# Patient Record
Sex: Female | Born: 1955 | Race: White | Hispanic: No | Marital: Married | Smoking: Never smoker
Health system: Southern US, Community
[De-identification: ages and names within clinical notes are randomized; demographics above are authoritative.]

## PROBLEM LIST (undated history)

## (undated) DIAGNOSIS — C50919 Malignant neoplasm of unspecified site of unspecified female breast: Secondary | ICD-10-CM

## (undated) DIAGNOSIS — Z923 Personal history of irradiation: Secondary | ICD-10-CM

## (undated) DIAGNOSIS — R112 Nausea with vomiting, unspecified: Secondary | ICD-10-CM

## (undated) DIAGNOSIS — G4730 Sleep apnea, unspecified: Secondary | ICD-10-CM

## (undated) DIAGNOSIS — I10 Essential (primary) hypertension: Secondary | ICD-10-CM

## (undated) DIAGNOSIS — Z9889 Other specified postprocedural states: Secondary | ICD-10-CM

## (undated) DIAGNOSIS — E119 Type 2 diabetes mellitus without complications: Secondary | ICD-10-CM

## (undated) DIAGNOSIS — R51 Headache: Secondary | ICD-10-CM

## (undated) DIAGNOSIS — K219 Gastro-esophageal reflux disease without esophagitis: Secondary | ICD-10-CM

## (undated) DIAGNOSIS — R519 Headache, unspecified: Secondary | ICD-10-CM

## (undated) DIAGNOSIS — E785 Hyperlipidemia, unspecified: Secondary | ICD-10-CM

## (undated) DIAGNOSIS — N6019 Diffuse cystic mastopathy of unspecified breast: Secondary | ICD-10-CM

## (undated) DIAGNOSIS — F419 Anxiety disorder, unspecified: Secondary | ICD-10-CM

## (undated) HISTORY — PX: BREAST CYST ASPIRATION: SHX578

## (undated) HISTORY — PX: OTHER SURGICAL HISTORY: SHX169

## (undated) HISTORY — PX: COLONOSCOPY: SHX174

---
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
## 2004-02-08 ENCOUNTER — Ambulatory Visit: Payer: Self-pay | Admitting: Internal Medicine

## 2004-02-24 ENCOUNTER — Ambulatory Visit: Payer: Self-pay | Admitting: Internal Medicine

## 2004-04-07 ENCOUNTER — Ambulatory Visit: Payer: Self-pay

## 2004-08-30 ENCOUNTER — Ambulatory Visit: Payer: Self-pay | Admitting: Internal Medicine

## 2006-03-06 ENCOUNTER — Ambulatory Visit: Payer: Self-pay | Admitting: Internal Medicine

## 2006-04-22 ENCOUNTER — Ambulatory Visit: Payer: Self-pay | Admitting: Unknown Physician Specialty

## 2007-04-02 ENCOUNTER — Ambulatory Visit: Payer: Self-pay | Admitting: Internal Medicine

## 2008-04-02 ENCOUNTER — Ambulatory Visit: Payer: Self-pay | Admitting: Internal Medicine

## 2008-08-12 ENCOUNTER — Ambulatory Visit: Payer: Self-pay | Admitting: Internal Medicine

## 2009-04-19 ENCOUNTER — Ambulatory Visit: Payer: Self-pay | Admitting: Internal Medicine

## 2010-03-29 ENCOUNTER — Ambulatory Visit: Payer: Self-pay | Admitting: Internal Medicine

## 2010-04-26 ENCOUNTER — Ambulatory Visit: Payer: Self-pay | Admitting: Internal Medicine

## 2010-05-26 ENCOUNTER — Ambulatory Visit: Payer: Self-pay | Admitting: Internal Medicine

## 2011-06-26 ENCOUNTER — Ambulatory Visit: Payer: Self-pay | Admitting: Internal Medicine

## 2012-02-01 ENCOUNTER — Ambulatory Visit: Payer: Self-pay | Admitting: Unknown Physician Specialty

## 2012-02-04 LAB — PATHOLOGY REPORT

## 2012-07-24 ENCOUNTER — Ambulatory Visit: Payer: Self-pay | Admitting: Internal Medicine

## 2013-07-27 ENCOUNTER — Ambulatory Visit: Payer: Self-pay | Admitting: Internal Medicine

## 2014-08-18 ENCOUNTER — Other Ambulatory Visit: Payer: Self-pay | Admitting: Internal Medicine

## 2014-08-18 DIAGNOSIS — Z1231 Encounter for screening mammogram for malignant neoplasm of breast: Secondary | ICD-10-CM

## 2014-09-08 ENCOUNTER — Ambulatory Visit
Admission: RE | Admit: 2014-09-08 | Discharge: 2014-09-08 | Disposition: A | Discharge status: Home / Self Care | Payer: BC Managed Care – PPO | Source: Ambulatory Visit | Attending: Internal Medicine | Admitting: Internal Medicine

## 2014-09-08 DIAGNOSIS — Z1231 Encounter for screening mammogram for malignant neoplasm of breast: Secondary | ICD-10-CM | POA: Diagnosis present

## 2015-08-18 ENCOUNTER — Other Ambulatory Visit: Payer: Self-pay | Admitting: Internal Medicine

## 2015-08-18 DIAGNOSIS — Z1231 Encounter for screening mammogram for malignant neoplasm of breast: Secondary | ICD-10-CM

## 2015-09-13 ENCOUNTER — Other Ambulatory Visit: Payer: Self-pay | Admitting: Internal Medicine

## 2015-09-13 ENCOUNTER — Ambulatory Visit
Admission: RE | Admit: 2015-09-13 | Discharge: 2015-09-13 | Disposition: A | Discharge status: Home / Self Care | Payer: BC Managed Care – PPO | Source: Ambulatory Visit | Attending: Internal Medicine | Admitting: Internal Medicine

## 2015-09-13 DIAGNOSIS — Z1231 Encounter for screening mammogram for malignant neoplasm of breast: Secondary | ICD-10-CM | POA: Diagnosis not present

## 2016-07-04 ENCOUNTER — Other Ambulatory Visit: Payer: Self-pay | Admitting: Internal Medicine

## 2016-07-04 DIAGNOSIS — Z1231 Encounter for screening mammogram for malignant neoplasm of breast: Secondary | ICD-10-CM

## 2016-09-19 ENCOUNTER — Ambulatory Visit
Admission: RE | Admit: 2016-09-19 | Discharge: 2016-09-19 | Disposition: A | Discharge status: Home / Self Care | Payer: BC Managed Care – PPO | Source: Ambulatory Visit | Attending: Internal Medicine | Admitting: Internal Medicine

## 2016-09-19 DIAGNOSIS — Z1231 Encounter for screening mammogram for malignant neoplasm of breast: Secondary | ICD-10-CM | POA: Insufficient documentation

## 2017-03-26 DIAGNOSIS — Z923 Personal history of irradiation: Secondary | ICD-10-CM

## 2017-03-26 HISTORY — DX: Personal history of irradiation: Z92.3

## 2017-05-09 ENCOUNTER — Encounter: Payer: Self-pay | Admitting: *Deleted

## 2017-05-10 ENCOUNTER — Other Ambulatory Visit: Payer: Self-pay

## 2017-05-10 ENCOUNTER — Encounter: Payer: Self-pay | Admitting: Anesthesiology

## 2017-05-10 ENCOUNTER — Encounter
Admission: RE | Disposition: A | Discharge status: Home / Self Care | Payer: Self-pay | Source: Ambulatory Visit | Attending: Unknown Physician Specialty

## 2017-05-10 ENCOUNTER — Ambulatory Visit: Payer: BC Managed Care – PPO | Admitting: Anesthesiology

## 2017-05-10 ENCOUNTER — Ambulatory Visit
Admission: RE | Admit: 2017-05-10 | Discharge: 2017-05-10 | Disposition: A | Discharge status: Home / Self Care | Payer: BC Managed Care – PPO | Source: Ambulatory Visit | Attending: Unknown Physician Specialty | Admitting: Unknown Physician Specialty

## 2017-05-10 DIAGNOSIS — Z885 Allergy status to narcotic agent status: Secondary | ICD-10-CM | POA: Diagnosis not present

## 2017-05-10 DIAGNOSIS — K5730 Diverticulosis of large intestine without perforation or abscess without bleeding: Secondary | ICD-10-CM | POA: Insufficient documentation

## 2017-05-10 DIAGNOSIS — Z7982 Long term (current) use of aspirin: Secondary | ICD-10-CM | POA: Insufficient documentation

## 2017-05-10 DIAGNOSIS — Z882 Allergy status to sulfonamides status: Secondary | ICD-10-CM | POA: Diagnosis not present

## 2017-05-10 DIAGNOSIS — I10 Essential (primary) hypertension: Secondary | ICD-10-CM | POA: Insufficient documentation

## 2017-05-10 DIAGNOSIS — E119 Type 2 diabetes mellitus without complications: Secondary | ICD-10-CM | POA: Diagnosis not present

## 2017-05-10 DIAGNOSIS — D123 Benign neoplasm of transverse colon: Secondary | ICD-10-CM | POA: Insufficient documentation

## 2017-05-10 DIAGNOSIS — Z7984 Long term (current) use of oral hypoglycemic drugs: Secondary | ICD-10-CM | POA: Diagnosis not present

## 2017-05-10 DIAGNOSIS — Z1211 Encounter for screening for malignant neoplasm of colon: Secondary | ICD-10-CM | POA: Insufficient documentation

## 2017-05-10 DIAGNOSIS — Z800 Family history of malignant neoplasm of digestive organs: Secondary | ICD-10-CM | POA: Insufficient documentation

## 2017-05-10 DIAGNOSIS — D122 Benign neoplasm of ascending colon: Secondary | ICD-10-CM | POA: Insufficient documentation

## 2017-05-10 DIAGNOSIS — K529 Noninfective gastroenteritis and colitis, unspecified: Secondary | ICD-10-CM | POA: Insufficient documentation

## 2017-05-10 DIAGNOSIS — Z79899 Other long term (current) drug therapy: Secondary | ICD-10-CM | POA: Insufficient documentation

## 2017-05-10 DIAGNOSIS — G4730 Sleep apnea, unspecified: Secondary | ICD-10-CM | POA: Diagnosis not present

## 2017-05-10 DIAGNOSIS — Z86010 Personal history of colonic polyps: Secondary | ICD-10-CM | POA: Diagnosis not present

## 2017-05-10 DIAGNOSIS — E785 Hyperlipidemia, unspecified: Secondary | ICD-10-CM | POA: Diagnosis not present

## 2017-05-10 DIAGNOSIS — K635 Polyp of colon: Secondary | ICD-10-CM | POA: Insufficient documentation

## 2017-05-10 DIAGNOSIS — K640 First degree hemorrhoids: Secondary | ICD-10-CM | POA: Diagnosis not present

## 2017-05-10 DIAGNOSIS — Z880 Allergy status to penicillin: Secondary | ICD-10-CM | POA: Insufficient documentation

## 2017-05-10 DIAGNOSIS — K219 Gastro-esophageal reflux disease without esophagitis: Secondary | ICD-10-CM | POA: Diagnosis not present

## 2017-05-10 HISTORY — DX: Headache, unspecified: R51.9

## 2017-05-10 HISTORY — DX: Gastro-esophageal reflux disease without esophagitis: K21.9

## 2017-05-10 HISTORY — DX: Sleep apnea, unspecified: G47.30

## 2017-05-10 HISTORY — DX: Type 2 diabetes mellitus without complications: E11.9

## 2017-05-10 HISTORY — DX: Hyperlipidemia, unspecified: E78.5

## 2017-05-10 HISTORY — DX: Diffuse cystic mastopathy of unspecified breast: N60.19

## 2017-05-10 HISTORY — DX: Headache: R51

## 2017-05-10 HISTORY — PX: COLONOSCOPY WITH PROPOFOL: SHX5780

## 2017-05-10 HISTORY — DX: Essential (primary) hypertension: I10

## 2017-05-10 SURGERY — COLONOSCOPY WITH PROPOFOL
Anesthesia: General

## 2017-05-10 MED ORDER — LIDOCAINE HCL (PF) 2 % IJ SOLN
INTRAMUSCULAR | Status: AC
  Filled 2017-05-10: qty 10

## 2017-05-10 MED ORDER — PROPOFOL 500 MG/50ML IV EMUL
INTRAVENOUS | Status: AC
  Filled 2017-05-10: qty 50

## 2017-05-10 MED ORDER — SODIUM CHLORIDE 0.9 % IV SOLN
INTRAVENOUS | Status: DC
  Administered 2017-05-10: 1000 mL via INTRAVENOUS

## 2017-05-10 MED ORDER — PROPOFOL 10 MG/ML IV BOLUS
INTRAVENOUS | Status: AC
  Filled 2017-05-10: qty 20

## 2017-05-10 MED ORDER — PHENYLEPHRINE HCL 10 MG/ML IJ SOLN
INTRAMUSCULAR | Status: DC | PRN
  Administered 2017-05-10 (×3): 100 ug via INTRAVENOUS

## 2017-05-10 MED ORDER — SODIUM CHLORIDE 0.9 % IV SOLN: INTRAVENOUS | Status: DC

## 2017-05-10 MED ORDER — ONDANSETRON HCL 4 MG/2ML IJ SOLN
INTRAMUSCULAR | Status: AC
  Filled 2017-05-10: qty 2

## 2017-05-10 MED ORDER — PROPOFOL 500 MG/50ML IV EMUL
INTRAVENOUS | Status: DC | PRN
  Administered 2017-05-10: 140 ug/kg/min via INTRAVENOUS

## 2017-05-10 MED ORDER — FENTANYL CITRATE (PF) 100 MCG/2ML IJ SOLN
INTRAMUSCULAR | Status: DC | PRN
  Administered 2017-05-10 (×2): 50 ug via INTRAVENOUS

## 2017-05-10 MED ORDER — LIDOCAINE HCL (PF) 1 % IJ SOLN
INTRAMUSCULAR | Status: AC
  Administered 2017-05-10: 0.3 mL
  Filled 2017-05-10: qty 2

## 2017-05-10 MED ORDER — PROPOFOL 10 MG/ML IV BOLUS
INTRAVENOUS | Status: DC | PRN
  Administered 2017-05-10: 100 mg via INTRAVENOUS

## 2017-05-10 MED ORDER — ONDANSETRON HCL 4 MG/2ML IJ SOLN
INTRAMUSCULAR | Status: DC | PRN
  Administered 2017-05-10: 4 mg via INTRAVENOUS

## 2017-05-10 MED ORDER — FENTANYL CITRATE (PF) 100 MCG/2ML IJ SOLN
INTRAMUSCULAR | Status: AC
  Filled 2017-05-10: qty 2

## 2017-05-10 MED ORDER — LIDOCAINE 2% (20 MG/ML) 5 ML SYRINGE
INTRAMUSCULAR | Status: DC | PRN
  Administered 2017-05-10: 30 mg via INTRAVENOUS

## 2017-05-10 MED ORDER — EPHEDRINE SULFATE 50 MG/ML IJ SOLN
INTRAMUSCULAR | Status: DC | PRN
  Administered 2017-05-10: 10 mg via INTRAVENOUS

## 2017-05-10 NOTE — Anesthesia Post-op Follow-up Note (Signed)
Anesthesia QCDR form completed.        

## 2017-05-10 NOTE — Transfer of Care (Signed)
Immediate Anesthesia Transfer of Care Note  Patient: SAIGE CANTON  Procedure(s) Performed: COLONOSCOPY WITH PROPOFOL (N/A )  Patient Location: PACU and Endoscopy Unit  Anesthesia Type:General  Level of Consciousness: sedated  Airway & Oxygen Therapy: Patient Spontanous Breathing and Patient connected to nasal cannula oxygen  Post-op Assessment: Report given to RN and Post -op Vital signs reviewed and stable  Post vital signs: Reviewed and stable  Last Vitals:  Vitals:   05/10/17 0748  BP: 127/68  Pulse: 71  Resp: 20  Temp: (!) 35.8 C  SpO2: 99%    Last Pain:  Vitals:   05/10/17 0748  TempSrc: Tympanic  PainSc: 6       Patients Stated Pain Goal: 5 (49/96/92 4932)  Complications: No apparent anesthesia complications

## 2017-05-10 NOTE — Anesthesia Postprocedure Evaluation (Signed)
Anesthesia Post Note  Patient: Gail Rivas  Procedure(s) Performed: COLONOSCOPY WITH PROPOFOL (N/A )  Patient location during evaluation: PACU Anesthesia Type: General Level of consciousness: awake and alert Pain management: pain level controlled Vital Signs Assessment: post-procedure vital signs reviewed and stable Respiratory status: spontaneous breathing, nonlabored ventilation, respiratory function stable and patient connected to nasal cannula oxygen Cardiovascular status: blood pressure returned to baseline and stable Postop Assessment: no apparent nausea or vomiting Anesthetic complications: no     Last Vitals:  Vitals:   05/10/17 0859 05/10/17 0918  BP: 96/65 104/70  Pulse:    Resp:    Temp:    SpO2:      Last Pain:  Vitals:   05/10/17 0852  TempSrc:   PainSc: 0-No pain                 Weslee Prestage S

## 2017-05-10 NOTE — Anesthesia Preprocedure Evaluation (Signed)
Anesthesia Evaluation  Patient identified by MRN, date of birth, ID band Patient awake    Reviewed: Allergy & Precautions, NPO status , Patient's Chart, lab work & pertinent test results, reviewed documented beta blocker date and time   Airway Mallampati: III  TM Distance: >3 FB     Dental  (+) Chipped   Pulmonary sleep apnea ,           Cardiovascular hypertension, Pt. on medications and Pt. on home beta blockers      Neuro/Psych  Headaches,    GI/Hepatic GERD  ,  Endo/Other  diabetes, Type 2  Renal/GU      Musculoskeletal   Abdominal   Peds  Hematology   Anesthesia Other Findings Does not use CPAP.  Reproductive/Obstetrics                             Anesthesia Physical Anesthesia Plan  ASA: III  Anesthesia Plan: General   Post-op Pain Management:    Induction: Intravenous  PONV Risk Score and Plan:   Airway Management Planned:   Additional Equipment:   Intra-op Plan:   Post-operative Plan:   Informed Consent: I have reviewed the patients History and Physical, chart, labs and discussed the procedure including the risks, benefits and alternatives for the proposed anesthesia with the patient or authorized representative who has indicated his/her understanding and acceptance.     Plan Discussed with: CRNA  Anesthesia Plan Comments:         Anesthesia Quick Evaluation

## 2017-05-10 NOTE — H&P (Signed)
Primary Care Physician:  Idelle Crouch, MD Primary Gastroenterologist:  Dr. Vira Agar  Pre-Procedure History & Physical: HPI:  Gail Rivas is a 62 y.o. female is here for an colonoscopy.   Past Medical History:  Diagnosis Date  . Diabetes mellitus without complication ([place])   . Fibrocystic disease of breast   . GERD (gastroesophageal reflux disease)   . Headache   . Hyperlipidemia   . Hypertension   . Sleep apnea     Past Surgical History:  Procedure Laterality Date  . BREAST CYST ASPIRATION Left   . COLONOSCOPY    . wisdom teeth removal      Prior to Admission medications   Medication Sig Start Date End Date Taking? Authorizing Provider  aspirin EC 81 MG tablet Take 81 mg by mouth daily.   Yes [provider]  canagliflozin (INVOKANA) 300 MG TABS tablet Take 300 mg by mouth daily before breakfast.   Yes [provider]  cetirizine (ZYRTEC) 10 MG tablet Take 10 mg by mouth daily.   Yes [provider]  glimepiride (AMARYL) 4 MG tablet Take 4 mg by mouth 2 (two) times daily.   Yes [provider]  ibuprofen (ADVIL,MOTRIN) 200 MG tablet Take 200 mg by mouth every 6 (six) hours as needed.   Yes [provider]  Lactobacillus Rhamnosus, GG, ([employer] PROBIOTIC, LACTOBACILLUS, PO) Take by mouth as needed.   Yes [provider]  losartan-hydrochlorothiazide (HYZAAR) 100-25 MG tablet Take 1 tablet by mouth daily.   Yes [provider]  metFORMIN (GLUCOPHAGE) 1000 MG tablet Take 1,000 mg by mouth 2 (two) times daily with a meal.   Yes [provider]  metoprolol (TOPROL-XL) 200 MG 24 hr tablet Take 200 mg by mouth daily.   Yes [provider]  sitaGLIPtin (JANUVIA) 100 MG tablet Take 100 mg by mouth daily.   Yes [provider]    Allergies as of 02/11/2017  . (Not on File)    Family History  Problem Relation Age of Onset  . Hypertension Mother   . Colon cancer Father   . Liver  cancer Father   . Diabetes Father   . Breast cancer Neg Hx     Social History   Socioeconomic History  . Marital status: Married    Spouse name: Not on file  . Number of children: Not on file  . Years of education: Not on file  . Highest education level: Not on file  Social Needs  . Financial resource strain: Not on file  . Food insecurity - worry: Not on file  . Food insecurity - inability: Not on file  . Transportation needs - medical: Not on file  . Transportation needs - non-medical: Not on file  Occupational History  . Not on file  Tobacco Use  . Smoking status: Never Smoker  . Smokeless tobacco: Never Used  Substance and Sexual Activity  . Alcohol use: No    Frequency: Never  . Drug use: No  . Sexual activity: Not on file  Other Topics Concern  . Not on file  Social History Narrative  . Not on file    Review of Systems: See HPI, otherwise negative ROS  Physical Exam: BP 127/68   Pulse 71   Temp (!) 96.5 F (35.8 C) (Tympanic)   Resp 20   Ht 5\' 5"  (1.651 m)   Wt 66.2 kg (146 lb)   SpO2 99%   BMI 24.30 kg/m  General:  Alert,  pleasant and cooperative in NAD Head:  Normocephalic and atraumatic. Neck:  Supple; no masses or thyromegaly. Lungs:  Clear throughout to auscultation.    Heart:  Regular rate and rhythm. Abdomen:  Soft, nontender and nondistended. Normal bowel sounds, without guarding, and without rebound.   Neurologic:  Alert and  oriented x4;  grossly normal neurologically.  Impression/Plan: Gail Rivas is here for an colonoscopy to be performed for [facility] colon polyps. And family history of colon cancer in father.  Risks, benefits, limitations, and alternatives regarding  colonoscopy have been reviewed with the patient.  Questions have been answered.  All parties agreeable.   Gail Cheers, MD  05/10/2017, 8:10 AM

## 2017-05-10 NOTE — Op Note (Signed)
[facility] Patient Name: Gail Rivas Procedure Date: 05/10/2017 8:04 AM MRN: [id number] Account #: [id number] Date of Birth: 1956-01-07 Admit Type: Outpatient Age: 62 Room: [facility] ENDO ROOM 1 Gender: Female Note Status: Finalized Procedure:            Colonoscopy Indications:          High risk colon cancer surveillance: Personal history                        of colonic polyps Providers:            Manya Silvas, MD Referring MD:         Leonie Douglas. Doy Hutching, MD (Referring MD) Medicines:            Propofol per Anesthesia Complications:        No immediate complications. Procedure:            Pre-Anesthesia Assessment:                       - After reviewing the risks and benefits, the patient                        was deemed in satisfactory condition to undergo the                        procedure.                       After obtaining informed consent, the colonoscope was                        passed under direct vision. Throughout the procedure,                        the patient's blood pressure, pulse, and oxygen                        saturations were monitored continuously. The                        Colonoscope was introduced through the anus and                        advanced to the the cecum, identified by appendiceal                        orifice and ileocecal valve. The colonoscopy was                        performed without difficulty. The patient tolerated the                        procedure well. The quality of the bowel preparation                        was good. Findings:      A small polyp was found in the hepatic flexure. The polyp was sessile.       The polyp was removed with a hot snare. Resection and retrieval were       complete.      A diminutive polyp was found  in the cecum. The polyp was sessile. The       polyp was removed with a jumbo cold forceps. Resection and retrieval       were complete.      A  diminutive polyp was found in the ascending colon. The polyp was       sessile. The polyp was removed with a jumbo cold forceps. Resection and       retrieval were complete.      A small polyp was found in the sigmoid colon. The polyp was sessile. The       polyp was removed with a hot snare. Resection and retrieval were       complete.      Multiple small-mouthed diverticula were found in the sigmoid colon,       descending colon, transverse colon and ascending colon.      Internal hemorrhoids were found during endoscopy. The hemorrhoids were       small and Grade I (internal hemorrhoids that do not prolapse). Impression:           - One small polyp at the hepatic flexure, removed with                        a hot snare. Resected and retrieved.                       - One diminutive polyp in the cecum, removed with a                        jumbo cold forceps. Resected and retrieved.                       - One diminutive polyp in the ascending colon, removed                        with a jumbo cold forceps. Resected and retrieved.                       - One small polyp in the sigmoid colon, removed with a                        hot snare. Resected and retrieved.                       - Diverticulosis in the sigmoid colon, in the                        descending colon, in the transverse colon and in the                        ascending colon.                       - Internal hemorrhoids. Recommendation:       - Await pathology results. Manya Silvas, MD 05/10/2017 8:47:30 AM This report has been signed electronically. Number of Addenda: 0 Note Initiated On: 05/10/2017 8:04 AM Scope Withdrawal Time: 0 hours 14 minutes 34 seconds  Total Procedure Duration: 0 hours 25 minutes 21 seconds       [facility]

## 2017-05-11 NOTE — Progress Notes (Signed)
Non-identifying voicemail.  No message left.

## 2017-05-13 ENCOUNTER — Encounter: Payer: Self-pay | Admitting: Unknown Physician Specialty

## 2017-05-13 LAB — SURGICAL PATHOLOGY

## 2017-09-17 ENCOUNTER — Other Ambulatory Visit: Payer: Self-pay | Admitting: Internal Medicine

## 2017-09-17 DIAGNOSIS — R911 Solitary pulmonary nodule: Secondary | ICD-10-CM

## 2017-09-24 ENCOUNTER — Ambulatory Visit
Admission: RE | Admit: 2017-09-24 | Discharge: 2017-09-24 | Disposition: A | Discharge status: Home / Self Care | Payer: BC Managed Care – PPO | Source: Ambulatory Visit | Attending: Internal Medicine | Admitting: Internal Medicine

## 2017-09-24 DIAGNOSIS — R911 Solitary pulmonary nodule: Secondary | ICD-10-CM | POA: Diagnosis not present

## 2017-09-25 ENCOUNTER — Other Ambulatory Visit: Payer: Self-pay | Admitting: Internal Medicine

## 2017-09-25 ENCOUNTER — Ambulatory Visit
Admission: RE | Admit: 2017-09-25 | Discharge: 2017-09-25 | Disposition: A | Discharge status: Home / Self Care | Payer: Self-pay | Source: Ambulatory Visit | Attending: Internal Medicine | Admitting: Internal Medicine

## 2017-09-25 DIAGNOSIS — R911 Solitary pulmonary nodule: Secondary | ICD-10-CM

## 2017-09-30 ENCOUNTER — Other Ambulatory Visit: Payer: Self-pay | Admitting: Internal Medicine

## 2017-09-30 DIAGNOSIS — Z1231 Encounter for screening mammogram for malignant neoplasm of breast: Secondary | ICD-10-CM

## 2017-10-15 ENCOUNTER — Ambulatory Visit
Admission: RE | Admit: 2017-10-15 | Discharge: 2017-10-15 | Disposition: A | Discharge status: Home / Self Care | Payer: BC Managed Care – PPO | Source: Ambulatory Visit | Attending: Internal Medicine | Admitting: Internal Medicine

## 2017-10-15 DIAGNOSIS — Z1231 Encounter for screening mammogram for malignant neoplasm of breast: Secondary | ICD-10-CM | POA: Diagnosis not present

## 2017-10-16 ENCOUNTER — Other Ambulatory Visit: Payer: Self-pay | Admitting: Internal Medicine

## 2017-10-16 DIAGNOSIS — R928 Other abnormal and inconclusive findings on diagnostic imaging of breast: Secondary | ICD-10-CM

## 2017-10-16 DIAGNOSIS — N6320 Unspecified lump in the left breast, unspecified quadrant: Secondary | ICD-10-CM

## 2017-10-23 ENCOUNTER — Ambulatory Visit
Admission: RE | Admit: 2017-10-23 | Discharge: 2017-10-23 | Disposition: A | Discharge status: Home / Self Care | Payer: BC Managed Care – PPO | Source: Ambulatory Visit | Attending: Internal Medicine | Admitting: Internal Medicine

## 2017-10-23 DIAGNOSIS — R928 Other abnormal and inconclusive findings on diagnostic imaging of breast: Secondary | ICD-10-CM

## 2017-10-23 DIAGNOSIS — N6320 Unspecified lump in the left breast, unspecified quadrant: Secondary | ICD-10-CM

## 2017-10-24 ENCOUNTER — Other Ambulatory Visit: Payer: Self-pay | Admitting: Internal Medicine

## 2017-10-24 DIAGNOSIS — C50919 Malignant neoplasm of unspecified site of unspecified female breast: Secondary | ICD-10-CM

## 2017-10-24 DIAGNOSIS — N6320 Unspecified lump in the left breast, unspecified quadrant: Secondary | ICD-10-CM

## 2017-10-24 DIAGNOSIS — R928 Other abnormal and inconclusive findings on diagnostic imaging of breast: Secondary | ICD-10-CM

## 2017-10-24 HISTORY — DX: Malignant neoplasm of unspecified site of unspecified female breast: C50.919

## 2017-10-30 ENCOUNTER — Ambulatory Visit
Admission: RE | Admit: 2017-10-30 | Discharge: 2017-10-30 | Disposition: A | Discharge status: Home / Self Care | Payer: BC Managed Care – PPO | Source: Ambulatory Visit | Attending: Internal Medicine | Admitting: Internal Medicine

## 2017-10-30 DIAGNOSIS — R928 Other abnormal and inconclusive findings on diagnostic imaging of breast: Secondary | ICD-10-CM

## 2017-10-30 DIAGNOSIS — N6320 Unspecified lump in the left breast, unspecified quadrant: Secondary | ICD-10-CM

## 2017-10-30 HISTORY — PX: BREAST BIOPSY: SHX20

## 2017-11-01 ENCOUNTER — Encounter: Payer: Self-pay | Admitting: *Deleted

## 2017-11-01 NOTE — Progress Notes (Signed)
  Oncology Nurse Navigator Documentation  Navigator Location: [facility]Med Onc (11/01/17 1400)   )Navigator Encounter Type: Introductory phone call (11/01/17 1400)   Abnormal Finding Date: 10/23/17 (11/01/17 1400) Confirmed Diagnosis Date: 10/31/17 (11/01/17 1400)                   Barriers/Navigation Needs: Coordination of Care (11/01/17 1400)   Interventions: Coordination of Care (11/01/17 1400)   Coordination of Care: Appts (11/01/17 1400)                  Time Spent with Patient: 30 (11/01/17 1400)   Called patient to establish navigation services.  She is newly diagnosed with invasive breast cancer.  Patient states she has an appointment on Monday with Dr. Lysle Pearl on Monday the 12th.  Informed I would get her scheduled to see a Medical oncologist also.  She is agreeable.  Will call her back on Monday with an appointment.  Will give educational literature at that time.

## 2017-11-04 ENCOUNTER — Encounter: Payer: Self-pay | Admitting: *Deleted

## 2017-11-04 LAB — SURGICAL PATHOLOGY

## 2017-11-04 NOTE — Progress Notes (Signed)
  Oncology Nurse Navigator Documentation  Navigator Location: [facility]Med Onc (11/04/17 1500) Referral date to RadOnc/MedOnc: 11/06/17 (11/04/17 1500) )Navigator Encounter Type: Telephone (11/04/17 1500) Telephone: Lahoma Crocker Call (11/04/17 1500)                       Barriers/Navigation Needs: Coordination of Care (11/04/17 1500)   Interventions: Coordination of Care (11/04/17 1500)   Coordination of Care: Appts (11/04/17 1500)                  Time Spent with Patient: 15 (11/04/17 1500)   Left patient a message with her appointment scheduled to see Dr. Tasia Catchings for medical oncology on 11/06/17 @ 2:30.

## 2017-11-06 ENCOUNTER — Other Ambulatory Visit: Payer: Self-pay

## 2017-11-06 ENCOUNTER — Encounter: Payer: Self-pay | Admitting: Oncology

## 2017-11-06 ENCOUNTER — Inpatient Hospital Stay: Payer: BC Managed Care – PPO | Attending: Oncology | Admitting: Oncology

## 2017-11-06 ENCOUNTER — Inpatient Hospital Stay: Payer: BC Managed Care – PPO

## 2017-11-06 ENCOUNTER — Encounter: Payer: Self-pay | Admitting: *Deleted

## 2017-11-06 VITALS — BP 147/91 | HR 70 | Temp 96.7°F | Resp 18 | Ht 65.0 in | Wt 157.4 lb

## 2017-11-06 DIAGNOSIS — Z8041 Family history of malignant neoplasm of ovary: Secondary | ICD-10-CM | POA: Insufficient documentation

## 2017-11-06 DIAGNOSIS — Z800 Family history of malignant neoplasm of digestive organs: Secondary | ICD-10-CM | POA: Diagnosis not present

## 2017-11-06 DIAGNOSIS — I10 Essential (primary) hypertension: Secondary | ICD-10-CM | POA: Diagnosis not present

## 2017-11-06 DIAGNOSIS — E119 Type 2 diabetes mellitus without complications: Secondary | ICD-10-CM | POA: Insufficient documentation

## 2017-11-06 DIAGNOSIS — C50412 Malignant neoplasm of upper-outer quadrant of left female breast: Secondary | ICD-10-CM

## 2017-11-06 DIAGNOSIS — Z170 Estrogen receptor positive status [ER+]: Secondary | ICD-10-CM | POA: Diagnosis not present

## 2017-11-06 LAB — COMPREHENSIVE METABOLIC PANEL
ALK PHOS: 59 U/L (ref 38–126)
ALT: 30 U/L (ref 0–44)
ANION GAP: 13 (ref 5–15)
AST: 28 U/L (ref 15–41)
Albumin: 4.6 g/dL (ref 3.5–5.0)
BILIRUBIN TOTAL: 1.4 mg/dL — AB (ref 0.3–1.2)
BUN: 17 mg/dL (ref 8–23)
CALCIUM: 9.4 mg/dL (ref 8.9–10.3)
CO2: 23 mmol/L (ref 22–32)
Chloride: 103 mmol/L (ref 98–111)
Creatinine, Ser: 0.8 mg/dL (ref 0.44–1.00)
Glucose, Bld: 138 mg/dL — ABNORMAL HIGH (ref 70–99)
POTASSIUM: 3.8 mmol/L (ref 3.5–5.1)
Sodium: 139 mmol/L (ref 135–145)
TOTAL PROTEIN: 7.4 g/dL (ref 6.5–8.1)

## 2017-11-06 LAB — CBC WITH DIFFERENTIAL/PLATELET
BASOS ABS: 0 10*3/uL (ref 0–0.1)
BASOS PCT: 1 %
Eosinophils Absolute: 0.1 10*3/uL (ref 0–0.7)
Eosinophils Relative: 1 %
HEMATOCRIT: 41.8 % (ref 35.0–47.0)
Hemoglobin: 14.2 g/dL (ref 12.0–16.0)
LYMPHS PCT: 32 %
Lymphs Abs: 2.5 10*3/uL (ref 1.0–3.6)
MCH: 31 pg (ref 26.0–34.0)
MCHC: 33.9 g/dL (ref 32.0–36.0)
MCV: 91.5 fL (ref 80.0–100.0)
MONO ABS: 0.5 10*3/uL (ref 0.2–0.9)
Monocytes Relative: 6 %
NEUTROS ABS: 4.8 10*3/uL (ref 1.4–6.5)
Neutrophils Relative %: 60 %
PLATELETS: 190 10*3/uL (ref 150–440)
RBC: 4.56 MIL/uL (ref 3.80–5.20)
RDW: 13.5 % (ref 11.5–14.5)
WBC: 8 10*3/uL (ref 3.6–11.0)

## 2017-11-06 NOTE — Progress Notes (Signed)
Hematology/Oncology Consult note [facility] Telephone:([phone] [phone]   Patient Care Team: Idelle Crouch, MD as PCP - General (Internal Medicine)  REFERRING PROVIDER: Idelle Crouch, MD CHIEF COMPLAINTS/REASON FOR VISIT:  Evaluation of breast cancer  HISTORY OF PRESENTING ILLNESS:  Gail Rivas is a  62 y.o.  female with PMH listed below who was referred to me for evaluation of breast cancer. Patient had screening mammogram and ultrasound on 10/15/2017 which showed left breast possible mass warrant further evaluation. Diagnostic breast mammogram on 7/31 2019 showed 6 mm spiculated mass slight distortion in the outer left breast.  Targeted ultrasound was performed showing no definite sonographic correlate is identified in the left breast.  Left axilla is negative for lymphadenopathy.   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  Grade 1, ER PR positive [>90%], HER-2 negative  Family history: Maternal grandmother was diagnosed with ovarian cancer in the 47s.  Father had colon cancer. History of radiation to chest: denies.    Patient appears very nervous today.  She is very tearful.  She ask them I am going to be okay.  She is accompanied by her husband. Otherwise reports feeling well.  Denies any cough, chest pain, abdominal pain, back pain  Review of Systems  Constitutional: Negative for chills, fever, malaise/fatigue and weight loss.  HENT: Negative for nosebleeds and sore throat.   Eyes: Negative for double vision, photophobia and redness.  Respiratory: Negative for cough, shortness of breath and wheezing.   Cardiovascular: Negative for chest pain, palpitations and orthopnea.  Gastrointestinal: Negative for abdominal pain, blood in stool, nausea and vomiting.  Genitourinary: Negative for dysuria.  Musculoskeletal: Negative for back pain,  myalgias and neck pain.  Skin: Negative for itching and rash.  Neurological: Negative for dizziness, tingling and tremors.  Endo/Heme/Allergies: Negative for environmental allergies. Does not bruise/bleed easily.  Psychiatric/Behavioral: Negative for depression. The patient is nervous/anxious.     MEDICAL HISTORY:  Past Medical History:  Diagnosis Date  . Diabetes mellitus without complication ([place])   . Fibrocystic disease of breast   . GERD (gastroesophageal reflux disease)   . Headache   . Hyperlipidemia   . Hypertension   . Sleep apnea     SURGICAL HISTORY: Past Surgical History:  Procedure Laterality Date  . BREAST BIOPSY Left 10/30/2017   left breast  stereo path penidng coil clip  . BREAST CYST ASPIRATION Left   . COLONOSCOPY    . COLONOSCOPY WITH PROPOFOL N/A 05/10/2017   Procedure: COLONOSCOPY WITH PROPOFOL;  Surgeon: Manya Silvas, MD;  Location: [facility] ENDOSCOPY;  Service: Endoscopy;  Laterality: N/A;  . wisdom teeth removal      SOCIAL HISTORY: Social History   Socioeconomic History  . Marital status: Married    Spouse name: Not on file  . Number of children: Not on file  . Years of education: Not on file  . Highest education level: Not on file  Occupational History  . Not on file  Social Needs  . Financial resource strain: Not on file  . Food insecurity:    Worry: Not on file    Inability: Not on file  . Transportation needs:    Medical: Not on file    Non-medical: Not on file  Tobacco Use  . Smoking status: Never Smoker  . Smokeless tobacco: Never Used  Substance and Sexual Activity  . Alcohol use: No    Frequency:  Never  . Drug use: No  . Sexual activity: Not on file  Lifestyle  . Physical activity:    Days per week: Not on file    Minutes per session: Not on file  . Stress: Not on file  Relationships  . Social connections:    Talks on phone: Not on file    Gets together: Not on file    Attends religious service: Not on file    Active  member of club or organization: Not on file    Attends meetings of clubs or organizations: Not on file    Relationship status: Not on file  . Intimate partner violence:    Fear of current or ex partner: Not on file    Emotionally abused: Not on file    Physically abused: Not on file    Forced sexual activity: Not on file  Other Topics Concern  . Not on file  Social History Narrative  . Not on file    FAMILY HISTORY: Family History  Problem Relation Age of Onset  . Hypertension Mother   . Colon cancer Father   . Liver cancer Father   . Diabetes Father   . Ovarian cancer Maternal Grandmother   . Breast cancer Neg Hx     ALLERGIES:  is allergic to codeine; sulfa antibiotics; and penicillin v.  MEDICATIONS:  Current Outpatient Medications  Medication Sig Dispense Refill  . aspirin EC 81 MG tablet Take 81 mg by mouth daily.    . cetirizine (ZYRTEC) 10 MG tablet Take 10 mg by mouth daily.    . clotrimazole-betamethasone (LOTRISONE) cream Apply topically.    Marland Kitchen glimepiride (AMARYL) 4 MG tablet Take 4 mg by mouth 2 (two) times daily.    Marland Kitchen ibuprofen (ADVIL,MOTRIN) 200 MG tablet Take 200 mg by mouth every 6 (six) hours as needed.    . Lactobacillus Rhamnosus, GG, ([employer] PROBIOTIC, LACTOBACILLUS, PO) Take by mouth as needed.    . metFORMIN (GLUCOPHAGE) 1000 MG tablet Take 1,000 mg by mouth 2 (two) times daily with a meal.    . metoprolol (TOPROL-XL) 200 MG 24 hr tablet Take 200 mg by mouth daily.    . polyethylene glycol-electrolytes (NULYTELY/GOLYTELY) 420 g solution Take by mouth.    . sitaGLIPtin (JANUVIA) 100 MG tablet Take 100 mg by mouth daily.    Marland Kitchen telmisartan-hydrochlorothiazide (MICARDIS HCT) 80-25 MG tablet Take by mouth. Take 1 tablet by mouth daily    . canagliflozin (INVOKANA) 300 MG TABS tablet Take 300 mg by mouth daily before breakfast.    . losartan-hydrochlorothiazide (HYZAAR) 100-25 MG tablet Take 1 tablet by mouth daily.     No current facility-administered  medications for this visit.      PHYSICAL EXAMINATION: ECOG PERFORMANCE STATUS: 0 - Asymptomatic Vitals:   11/06/17 1432  BP: (!) 147/91  Pulse: 70  Resp: 18  Temp: (!) 96.7 F (35.9 C)   Filed Weights   11/06/17 1432  Weight: 157 lb 6.4 oz (71.4 kg)    Physical Exam  Constitutional: She is oriented to person, place, and time. She appears well-developed and well-nourished. No distress.  HENT:  Head: Normocephalic and atraumatic.  Right Ear: External ear normal.  Left Ear: External ear normal.  Mouth/Throat: Oropharynx is clear and moist.  Eyes: Pupils are equal, round, and reactive to light. EOM are normal. No scleral icterus.  Neck: Normal range of motion. Neck supple.  Cardiovascular: Normal rate, regular rhythm and normal heart sounds.  Pulmonary/Chest: Effort normal.  No respiratory distress. She has no wheezes.  Abdominal: Soft. Bowel sounds are normal. She exhibits no distension and no mass. There is no tenderness.  Musculoskeletal: Normal range of motion. She exhibits no edema or deformity.  Neurological: She is alert and oriented to person, place, and time. No cranial nerve deficit. Coordination normal.  Skin: Skin is warm and dry. No rash noted. No erythema.  Psychiatric: She has a normal mood and affect. Her behavior is normal. Thought content normal.  Breast exam was performed in seated and lying down position. No palpable mass bilateral breast.  No palpable axillary lymph node bilaterally.     LABORATORY DATA:  I have reviewed the data as listed Lab Results  Component Value Date   WBC 8.0 11/06/2017   HGB 14.2 11/06/2017   HCT 41.8 11/06/2017   MCV 91.5 11/06/2017   PLT 190 11/06/2017   Recent Labs    11/06/17 1546  NA 139  K 3.8  CL 103  CO2 23  GLUCOSE 138*  BUN 17  CREATININE 0.80  CALCIUM 9.4  GFRNONAA >60  GFRAA >60  PROT 7.4  ALBUMIN 4.6  AST 28  ALT 30  ALKPHOS 59  BILITOT 1.4*   Iron/TIBC/Ferritin/ %Sat No results found for:  IRON, TIBC, FERRITIN, IRONPCTSAT      ASSESSMENT & PLAN:  1. Malignant neoplasm of upper-outer quadrant of left breast in female, estrogen receptor positive ([place])   2. Family history of ovarian cancer    # Image was independently reviewed by me and discussed with patient.  I also discussed with her about the pathology reports, breast cancer diagnosis, extent of the disease and prognosis. It appears that she has likely stage I breast cancer ER PR positive HER-2 negative. Recommend lumpectomy with sentinel lymph node biopsy.  If no lymph node involvement, recommend sending Oncotype DX for further evaluate if she needs chemotherapy.  Adjuvant treatment plan pending final surgical pathology and will be discussed post surgery. Check baseline CBC and CMP. #Family history of ovarian cancer, personal history of breast cancer recommend genetic testing.  Will refer to genetic counselor.  Patient is a very nervous and had a lot of questions.  All questions answered to her satisfaction. Orders Placed This Encounter  Procedures  . CBC with Differential/Platelet    Standing Status:   Future    Number of Occurrences:   1    Standing Expiration Date:   11/07/2018  . Comprehensive metabolic panel    Standing Status:   Future    Number of Occurrences:   1    Standing Expiration Date:   11/07/2018    All questions were answered. The patient knows to call the clinic with any problems questions or concerns.  Return of visit: To be determined. Thank you for this kind referral and the opportunity to participate in the care of this patient. A copy of today's note is routed to referring provider  Total face to face encounter time for this patient visit was  60 min. >50% of the time was  spent in counseling and coordination of care.    Earlie Server, MD, PhD Hematology Oncology [facility] Pager- [phone] 11/06/2017

## 2017-11-06 NOTE — Progress Notes (Signed)
Patient here for follow up. She is very tearful and upset due to new dx.

## 2017-11-06 NOTE — Progress Notes (Signed)
  Oncology Nurse Navigator Documentation  Navigator Location: [facility] Onc (11/06/17 1500) Referral date to RadOnc/MedOnc: 11/06/17 (11/06/17 1500) )Navigator Encounter Type: Initial MedOnc (11/06/17 1500)           Genetic Counseling Type: Non-Urgent (11/06/17 1500)         Patient Visit Type: MedOnc (11/06/17 1500) Treatment Phase: Pre-Tx/Tx Discussion (11/06/17 1500) Barriers/Navigation Needs: Education (11/06/17 1500) Education: Newly Diagnosed Cancer Education (11/06/17 1500) Interventions: Education (11/06/17 1500)     Education Method: Verbal;Written (11/06/17 1500)  Support Groups/Services: Breast Support Group (11/06/17 1500)             Time Spent with Patient: 3 (11/06/17 1500)   Met patient and her husband today during her initial medical oncology consult with Dr. Tasia Catchings.  Patient and I went to elementary school together at [employer]. Patient has already met with Dr. Lysle Pearl for surgical consult.  Surgery date for lumpectomy is pending.  Gave patient breast cancer educational literature, "My Breast Cancer Treatment Handbook" by Josephine Igo, RN.  She is to call if she has any questions or needs.

## 2017-11-07 ENCOUNTER — Telehealth: Payer: Self-pay | Admitting: Genetic Counselor

## 2017-11-07 NOTE — Telephone Encounter (Signed)
Dr. Tasia Catchings is referring [name] for genetic counseling due to a personal and family history of cancer. I left her a message to call and schedule this telegenetics visit to be done by phone at her convenience.   Gail Rivas, [place], [place] Genetic Counselor Phone: [phone]

## 2017-11-11 ENCOUNTER — Other Ambulatory Visit: Payer: Self-pay | Admitting: Surgery

## 2017-11-11 DIAGNOSIS — C50412 Malignant neoplasm of upper-outer quadrant of left female breast: Secondary | ICD-10-CM

## 2017-11-12 ENCOUNTER — Ambulatory Visit: Payer: Self-pay | Admitting: Surgery

## 2017-11-12 ENCOUNTER — Other Ambulatory Visit: Payer: Self-pay | Admitting: Surgery

## 2017-11-12 DIAGNOSIS — C50412 Malignant neoplasm of upper-outer quadrant of left female breast: Secondary | ICD-10-CM

## 2017-11-12 NOTE — H&P (Signed)
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 female who was referred by Idelle Crouch, MD for evaluation of breast mass. Change was noted on last screening mammogram. Patient does not routinely do self breast exams.   Age of menarche was 88. Age of menopause was 35. Patient admits to hormonal thera Patient denies nipple discharge. Patient denies previous breast biopsy but states she has had a cyst drained. Patient denies a personal history of breast cancer.   Past Medical History:  has a past medical history of Diabetes mellitus type 2, uncomplicated (CMS-HCC), Fibrocystic disease of breast, GERD (gastroesophageal reflux disease), Headache, Hyperlipidemia, Hypertension, and Sleep apnea.  Past Surgical History:  has a past surgical history that includes [place]; Colonoscopy (04/07/2004); Colonoscopy (02/01/2012, 02/21/2007); and Colonoscopy (05/10/2017).left breast cyst  Family History: family history includes Colon cancer in her father; Diabetes type II in her father; High blood pressure (Hypertension) in her mother; Liver cancer in her father.  MGM hx of ovarian CA at age85  Social History:  reports that she has never smoked. She has never used smokeless tobacco. She reports that she does not drink alcohol. Her drug history is not on file.  Current Medications: has a current medication list which includes the following prescription(s): aspirin, cetirizine, clotrimazole-betamethasone, glimepiride, ibuprofen, lactobac no.41/bifidobact no.7, metformin, metoprolol succinate, peg-electrolyte, pioglitazone, sitagliptin, and telmisartan-hydrochlorothiazide.  Allergies:       Allergies as of 11/04/2017 - Reviewed 11/04/2017  Allergen Reaction Noted  . Codeine Vomiting 01/24/2017  . Penicillin v Rash 06/13/2013  . Sulfa (sulfonamide antibiotics) Vomiting 06/13/2013     ROS:  A 15 point review of systems was performed and was negative except as noted in HPI   Objective:   BP 113/74   Pulse 64   Temp 36.2 C (97.1 F) (Oral)   Ht 165.1 cm (5\' 5" )   Wt 72.6 kg (160 lb)   BMI 26.63 kg/m   Constitutional :  alert, appears stated age, cooperative and no distress  Lymphatics/Throat:  no asymmetry, masses, or scars  Respiratory:  clear to auscultation bilaterally  Cardiovascular:  regular rate and rhythm  Gastrointestinal: soft, non-tender; bowel sounds normal; no masses,  no organomegaly.   Musculoskeletal: Steady gait and movement  Skin: Cool and moist, core biopsy site on left breast with some bruising  Psychiatric: Normal affect, non-agitated, not confused  Breast:  Chaperone present for exam.  breasts appear normal, no suspicious masses, no skin or nipple changes or axillary nodes.    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  Plan:   1. 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 The risks of general anesthetic, if used, includes MI, CVA, sudden death or even reaction to anesthetic medications also discussed.  Typical post-op recovery time and possbility of activity restrictions were also discussed.  Alternatives include continued observation.  Benefits include possible symptom relief, pathologic evaluation, and/or curative excision.   The patient verbalized understanding and all questions were answered to the patient's satisfaction.  2. Patient has elected to proceed  with surgical treatment. Procedure will be scheduled.  Written consent was obtained..     Electronically signed by Benjamine Sprague, DO on 11/04/2017 7:12 PM

## 2017-11-12 NOTE — H&P (View-Only) (Signed)
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 female who was referred by Idelle Crouch, MD for evaluation of breast mass. Change was noted on last screening mammogram. Patient does not routinely do self breast exams.   Age of menarche was 39. Age of menopause was 9. Patient admits to hormonal thera Patient denies nipple discharge. Patient denies previous breast biopsy but states she has had a cyst drained. Patient denies a personal history of breast cancer.   Past Medical History:  has a past medical history of Diabetes mellitus type 2, uncomplicated (CMS-HCC), Fibrocystic disease of breast, GERD (gastroesophageal reflux disease), Headache, Hyperlipidemia, Hypertension, and Sleep apnea.  Past Surgical History:  has a past surgical history that includes [place]; Colonoscopy (04/07/2004); Colonoscopy (02/01/2012, 02/21/2007); and Colonoscopy (05/10/2017).left breast cyst  Family History: family history includes Colon cancer in her father; Diabetes type II in her father; High blood pressure (Hypertension) in her mother; Liver cancer in her father.  MGM hx of ovarian CA at age85  Social History:  reports that she has never smoked. She has never used smokeless tobacco. She reports that she does not drink alcohol. Her drug history is not on file.  Current Medications: has a current medication list which includes the following prescription(s): aspirin, cetirizine, clotrimazole-betamethasone, glimepiride, ibuprofen, lactobac no.41/bifidobact no.7, metformin, metoprolol succinate, peg-electrolyte, pioglitazone, sitagliptin, and telmisartan-hydrochlorothiazide.  Allergies:       Allergies as of 11/04/2017 - Reviewed 11/04/2017  Allergen Reaction Noted  . Codeine Vomiting 01/24/2017  . Penicillin v Rash 06/13/2013  . Sulfa (sulfonamide antibiotics) Vomiting 06/13/2013     ROS:  A 15 point review of systems was performed and was negative except as noted in HPI   Objective:   BP 113/74   Pulse 64   Temp 36.2 C (97.1 F) (Oral)   Ht 165.1 cm (5\' 5" )   Wt 72.6 kg (160 lb)   BMI 26.63 kg/m   Constitutional :  alert, appears stated age, cooperative and no distress  Lymphatics/Throat:  no asymmetry, masses, or scars  Respiratory:  clear to auscultation bilaterally  Cardiovascular:  regular rate and rhythm  Gastrointestinal: soft, non-tender; bowel sounds normal; no masses,  no organomegaly.   Musculoskeletal: Steady gait and movement  Skin: Cool and moist, core biopsy site on left breast with some bruising  Psychiatric: Normal affect, non-agitated, not confused  Breast:  Chaperone present for exam.  breasts appear normal, no suspicious masses, no skin or nipple changes or axillary nodes.    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  Plan:   1. 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 The risks of general anesthetic, if used, includes MI, CVA, sudden death or even reaction to anesthetic medications also discussed.  Typical post-op recovery time and possbility of activity restrictions were also discussed.  Alternatives include continued observation.  Benefits include possible symptom relief, pathologic evaluation, and/or curative excision.   The patient verbalized understanding and all questions were answered to the patient's satisfaction.  2. Patient has elected to proceed  with surgical treatment. Procedure will be scheduled.  Written consent was obtained..     Electronically signed by Benjamine Sprague, DO on 11/04/2017 7:12 PM

## 2017-11-14 ENCOUNTER — Encounter
Admission: RE | Admit: 2017-11-14 | Discharge: 2017-11-14 | Disposition: A | Discharge status: Home / Self Care | Payer: BC Managed Care – PPO | Source: Ambulatory Visit | Attending: Surgery | Admitting: Surgery

## 2017-11-14 ENCOUNTER — Other Ambulatory Visit: Payer: Self-pay

## 2017-11-14 ENCOUNTER — Other Ambulatory Visit: Payer: Self-pay | Admitting: *Deleted

## 2017-11-14 DIAGNOSIS — I10 Essential (primary) hypertension: Secondary | ICD-10-CM | POA: Diagnosis not present

## 2017-11-14 DIAGNOSIS — Z01810 Encounter for preprocedural cardiovascular examination: Secondary | ICD-10-CM | POA: Insufficient documentation

## 2017-11-14 HISTORY — DX: Other specified postprocedural states: R11.2

## 2017-11-14 HISTORY — DX: Other specified postprocedural states: Z98.890

## 2017-11-14 HISTORY — DX: Anxiety disorder, unspecified: F41.9

## 2017-11-14 NOTE — Patient Instructions (Signed)
Your procedure is scheduled on: Thursday 11/21/17  Report to [place]. To find out your arrival time please call [phone] between 1PM - 3PM on Wednesday 11/20/17  Remember: Instructions that are not followed completely may result in serious medical risk, up to and including death, or upon the discretion of your surgeon and anesthesiologist your surgery may need to be rescheduled.     _X__ 1. Do not eat food after midnight the night before your procedure.                 No gum chewing or hard candies. You may drink clear liquids up to 2 hours                 before you are scheduled to arrive for your surgery- DO not drink clear                 liquids within 2 hours of the start of your surgery.                 Clear Liquids include:  water, apple juice without pulp, clear carbohydrate                 drink such as Clearfast or Gatorade, Black Coffee or Tea (Do not add                 anything to coffee or tea).  __X__2.  On the morning of surgery brush your teeth with toothpaste and water, you may rinse your mouth with mouthwash if you wish.  Do not swallow any           toothpaste of mouthwash.     _X__ 3.  No Alcohol for 24 hours before or after surgery.   _X__ 4.  Do Not Smoke or use e-cigarettes For 24 Hours Prior to Your Surgery.                 Do not use any chewable tobacco products for at least 6 hours prior to                 surgery.  ____  5.  Bring all medications with you on the day of surgery if instructed.   __X__  6.  Notify your doctor if there is any change in your medical condition      (cold, fever, infections).     Do not wear jewelry, make-up, hairpins, clips or nail polish. Do not wear lotions, powders, or perfumes.  Do not shave 48 hours prior to surgery. Men may shave face and neck. Do not bring valuables to the hospital.    [facility] is not responsible for any belongings or  valuables.  Contacts, dentures/partials or body piercings may not be worn into surgery. Bring a case for your contacts, glasses or hearing aids, a denture cup will be supplied. Leave your suitcase in the car. After surgery it may be brought to your room. For patients admitted to the hospital, discharge time is determined by your treatment team.   Patients discharged the day of surgery will not be allowed to drive home.   Please read over the following fact sheets that you were given:   MRSA Information  __X__ Take these medicines the morning of surgery with A SIP OF WATER:     1. cetirizine (ZYRTEC) 10 MG tablet  2. metoprolol (TOPROL-XL) 200 MG 24 hr tablet  3.  4.  5.  6.     __X__ Use CHG Soap as directed   __X__ Stop Metformin 2 days prior to surgery      __X__ Stop Blood Thinners Coumadin/Plavix/Xarelto/Pleta/Pradaxa/Eliquis/Effient/Aspirin TODAY  __X__ Stop Anti-inflammatories TODAY such as Advil, Ibuprofen, Motrin, BC or Goodies Powder, Naprosyn, Naproxen, Aleve, Aspirin, Meloxicam. May take Tylenol if needed for pain or discomfort.

## 2017-11-15 NOTE — Telephone Encounter (Signed)
Last outreach to Ms. Algeo to see if she is interested in proceeding with genetic counseling. She is welcome to call whenever she is ready. Her blood at [facility] will be help for another month or so and will be discarded if no order is placed on her behalf.  Steele Berg, MS, [facility] Phone: [phone] [email or web address]

## 2017-11-20 MED ORDER — VANCOMYCIN HCL IN DEXTROSE 1-5 GM/200ML-% IV SOLN
1000.0000 mg | INTRAVENOUS | Status: AC
  Administered 2017-11-21: 1000 mg via INTRAVENOUS

## 2017-11-21 ENCOUNTER — Ambulatory Visit
Admission: RE | Admit: 2017-11-21 | Discharge: 2017-11-21 | Disposition: A | Discharge status: Home / Self Care | Payer: BC Managed Care – PPO | Source: Ambulatory Visit | Attending: Surgery | Admitting: Surgery

## 2017-11-21 ENCOUNTER — Ambulatory Visit: Payer: BC Managed Care – PPO | Admitting: Certified Registered Nurse Anesthetist

## 2017-11-21 ENCOUNTER — Encounter
Admission: RE | Admit: 2017-11-21 | Discharge: 2017-11-21 | Disposition: A | Discharge status: Home / Self Care | Payer: BC Managed Care – PPO | Source: Ambulatory Visit | Attending: Surgery | Admitting: Surgery

## 2017-11-21 ENCOUNTER — Encounter
Admission: RE | Disposition: A | Discharge status: Home / Self Care | Payer: Self-pay | Source: Ambulatory Visit | Attending: Surgery

## 2017-11-21 ENCOUNTER — Ambulatory Visit: Payer: BC Managed Care – PPO

## 2017-11-21 ENCOUNTER — Encounter: Payer: Self-pay | Admitting: Certified Registered Nurse Anesthetist

## 2017-11-21 DIAGNOSIS — Z170 Estrogen receptor positive status [ER+]: Secondary | ICD-10-CM | POA: Diagnosis not present

## 2017-11-21 DIAGNOSIS — Z800 Family history of malignant neoplasm of digestive organs: Secondary | ICD-10-CM | POA: Diagnosis not present

## 2017-11-21 DIAGNOSIS — Z79899 Other long term (current) drug therapy: Secondary | ICD-10-CM | POA: Diagnosis not present

## 2017-11-21 DIAGNOSIS — Z882 Allergy status to sulfonamides status: Secondary | ICD-10-CM | POA: Diagnosis not present

## 2017-11-21 DIAGNOSIS — I10 Essential (primary) hypertension: Secondary | ICD-10-CM | POA: Diagnosis not present

## 2017-11-21 DIAGNOSIS — C50412 Malignant neoplasm of upper-outer quadrant of left female breast: Secondary | ICD-10-CM | POA: Insufficient documentation

## 2017-11-21 DIAGNOSIS — Z885 Allergy status to narcotic agent status: Secondary | ICD-10-CM | POA: Insufficient documentation

## 2017-11-21 DIAGNOSIS — Z7982 Long term (current) use of aspirin: Secondary | ICD-10-CM | POA: Diagnosis not present

## 2017-11-21 DIAGNOSIS — Z7984 Long term (current) use of oral hypoglycemic drugs: Secondary | ICD-10-CM | POA: Insufficient documentation

## 2017-11-21 DIAGNOSIS — Z8249 Family history of ischemic heart disease and other diseases of the circulatory system: Secondary | ICD-10-CM | POA: Diagnosis not present

## 2017-11-21 DIAGNOSIS — Z8041 Family history of malignant neoplasm of ovary: Secondary | ICD-10-CM | POA: Insufficient documentation

## 2017-11-21 DIAGNOSIS — G4730 Sleep apnea, unspecified: Secondary | ICD-10-CM | POA: Insufficient documentation

## 2017-11-21 DIAGNOSIS — E119 Type 2 diabetes mellitus without complications: Secondary | ICD-10-CM | POA: Insufficient documentation

## 2017-11-21 DIAGNOSIS — Z880 Allergy status to penicillin: Secondary | ICD-10-CM | POA: Diagnosis not present

## 2017-11-21 HISTORY — PX: BREAST LUMPECTOMY: SHX2

## 2017-11-21 HISTORY — PX: PARTIAL MASTECTOMY WITH NEEDLE LOCALIZATION AND AXILLARY SENTINEL LYMPH NODE BX: SHX6009

## 2017-11-21 HISTORY — DX: Malignant neoplasm of unspecified site of unspecified female breast: C50.919

## 2017-11-21 LAB — GLUCOSE, CAPILLARY
GLUCOSE-CAPILLARY: 189 mg/dL — AB (ref 70–99)
GLUCOSE-CAPILLARY: 187 mg/dL — AB (ref 70–99)

## 2017-11-21 SURGERY — PARTIAL MASTECTOMY WITH NEEDLE LOCALIZATION AND AXILLARY SENTINEL LYMPH NODE BX
Anesthesia: General | Laterality: Left | Wound class: Clean

## 2017-11-21 MED ORDER — SODIUM CHLORIDE 0.9 % IV SOLN
INTRAVENOUS | Status: DC
  Administered 2017-11-21 (×2): via INTRAVENOUS

## 2017-11-21 MED ORDER — DEXAMETHASONE SODIUM PHOSPHATE 10 MG/ML IJ SOLN
INTRAMUSCULAR | Status: AC
  Filled 2017-11-21: qty 1

## 2017-11-21 MED ORDER — ACETAMINOPHEN 10 MG/ML IV SOLN
INTRAVENOUS | Status: DC | PRN
  Administered 2017-11-21: 1000 mg via INTRAVENOUS

## 2017-11-21 MED ORDER — FAMOTIDINE 20 MG PO TABS
20.0000 mg | ORAL_TABLET | Freq: Once | ORAL | Status: AC
  Administered 2017-11-21: 20 mg via ORAL

## 2017-11-21 MED ORDER — ONDANSETRON HCL 4 MG/2ML IJ SOLN
INTRAMUSCULAR | Status: AC
  Filled 2017-11-21: qty 2

## 2017-11-21 MED ORDER — PROPOFOL 10 MG/ML IV BOLUS
INTRAVENOUS | Status: AC
  Filled 2017-11-21: qty 40

## 2017-11-21 MED ORDER — PROMETHAZINE HCL 25 MG/ML IJ SOLN
INTRAMUSCULAR | Status: AC
  Filled 2017-11-21: qty 1

## 2017-11-21 MED ORDER — FENTANYL CITRATE (PF) 100 MCG/2ML IJ SOLN
INTRAMUSCULAR | Status: DC | PRN
  Administered 2017-11-21 (×2): 25 ug via INTRAVENOUS
  Administered 2017-11-21: 50 ug via INTRAVENOUS

## 2017-11-21 MED ORDER — KETOROLAC TROMETHAMINE 30 MG/ML IJ SOLN
INTRAMUSCULAR | Status: DC | PRN
  Administered 2017-11-21: 30 mg via INTRAVENOUS

## 2017-11-21 MED ORDER — DOCUSATE SODIUM 100 MG PO CAPS: 100.0000 mg | ORAL_CAPSULE | Freq: Two times a day (BID) | ORAL | 0 refills | Status: AC | PRN

## 2017-11-21 MED ORDER — FAMOTIDINE 20 MG PO TABS
ORAL_TABLET | ORAL | Status: AC
  Filled 2017-11-21: qty 1

## 2017-11-21 MED ORDER — IBUPROFEN 200 MG PO TABS: 600.0000 mg | ORAL_TABLET | Freq: Four times a day (QID) | ORAL | 0 refills | Status: AC | PRN

## 2017-11-21 MED ORDER — SCOPOLAMINE 1 MG/3DAYS TD PT72
MEDICATED_PATCH | TRANSDERMAL | Status: AC
  Filled 2017-11-21: qty 1

## 2017-11-21 MED ORDER — ONDANSETRON HCL 4 MG/2ML IJ SOLN
INTRAMUSCULAR | Status: DC | PRN
  Administered 2017-11-21: 4 mg via INTRAVENOUS

## 2017-11-21 MED ORDER — SUCCINYLCHOLINE CHLORIDE 20 MG/ML IJ SOLN
INTRAMUSCULAR | Status: DC | PRN
  Administered 2017-11-21: 120 mg via INTRAVENOUS

## 2017-11-21 MED ORDER — PHENYLEPHRINE HCL 10 MG/ML IJ SOLN
INTRAMUSCULAR | Status: DC | PRN
  Administered 2017-11-21: 100 ug via INTRAVENOUS

## 2017-11-21 MED ORDER — SODIUM CHLORIDE FLUSH 0.9 % IV SOLN
INTRAVENOUS | Status: AC
  Filled 2017-11-21: qty 10

## 2017-11-21 MED ORDER — PROPOFOL 500 MG/50ML IV EMUL
INTRAVENOUS | Status: AC
  Filled 2017-11-21: qty 50

## 2017-11-21 MED ORDER — CHLORHEXIDINE GLUCONATE CLOTH 2 % EX PADS: 6.0000 | MEDICATED_PAD | Freq: Once | CUTANEOUS | Status: DC

## 2017-11-21 MED ORDER — ROCURONIUM BROMIDE 50 MG/5ML IV SOLN
INTRAVENOUS | Status: AC
  Filled 2017-11-21: qty 1

## 2017-11-21 MED ORDER — MIDAZOLAM HCL 2 MG/2ML IJ SOLN
INTRAMUSCULAR | Status: DC | PRN
  Administered 2017-11-21: 2 mg via INTRAVENOUS

## 2017-11-21 MED ORDER — ROCURONIUM BROMIDE 100 MG/10ML IV SOLN
INTRAVENOUS | Status: DC | PRN
  Administered 2017-11-21 (×3): 5 mg via INTRAVENOUS
  Administered 2017-11-21: 30 mg via INTRAVENOUS

## 2017-11-21 MED ORDER — PROPOFOL 10 MG/ML IV BOLUS
INTRAVENOUS | Status: DC | PRN
  Administered 2017-11-21: 180 mg via INTRAVENOUS

## 2017-11-21 MED ORDER — ACETAMINOPHEN 10 MG/ML IV SOLN
INTRAVENOUS | Status: AC
  Filled 2017-11-21: qty 100

## 2017-11-21 MED ORDER — DEXAMETHASONE SODIUM PHOSPHATE 10 MG/ML IJ SOLN
INTRAMUSCULAR | Status: DC | PRN
  Administered 2017-11-21: 10 mg via INTRAVENOUS

## 2017-11-21 MED ORDER — CHLORHEXIDINE GLUCONATE CLOTH 2 % EX PADS
6.0000 | MEDICATED_PAD | Freq: Once | CUTANEOUS | Status: AC
  Administered 2017-11-21: 6 via TOPICAL

## 2017-11-21 MED ORDER — LIDOCAINE HCL (PF) 1 % IJ SOLN
INTRAMUSCULAR | Status: AC
  Filled 2017-11-21: qty 30

## 2017-11-21 MED ORDER — SUGAMMADEX SODIUM 200 MG/2ML IV SOLN
INTRAVENOUS | Status: AC
  Filled 2017-11-21: qty 2

## 2017-11-21 MED ORDER — PROMETHAZINE HCL 25 MG/ML IJ SOLN
6.2500 mg | INTRAMUSCULAR | Status: DC | PRN
  Administered 2017-11-21: 6.25 mg via INTRAVENOUS

## 2017-11-21 MED ORDER — TECHNETIUM TC 99M SULFUR COLLOID FILTERED
0.7170 | Freq: Once | INTRAVENOUS | Status: AC | PRN
  Administered 2017-11-21: 0.717 via INTRADERMAL

## 2017-11-21 MED ORDER — PROPOFOL 500 MG/50ML IV EMUL
INTRAVENOUS | Status: DC | PRN
  Administered 2017-11-21: 100 ug/kg/min via INTRAVENOUS

## 2017-11-21 MED ORDER — SODIUM CHLORIDE 0.9 % IV SOLN
INTRAVENOUS | Status: DC | PRN
  Administered 2017-11-21: 25 ug/min via INTRAVENOUS

## 2017-11-21 MED ORDER — LIDOCAINE HCL (CARDIAC) PF 100 MG/5ML IV SOSY
PREFILLED_SYRINGE | INTRAVENOUS | Status: DC | PRN
  Administered 2017-11-21: 80 mg via INTRAVENOUS

## 2017-11-21 MED ORDER — MEPERIDINE HCL 50 MG/ML IJ SOLN: 6.2500 mg | INTRAMUSCULAR | Status: DC | PRN

## 2017-11-21 MED ORDER — LIDOCAINE HCL (PF) 2 % IJ SOLN
INTRAMUSCULAR | Status: AC
  Filled 2017-11-21: qty 10

## 2017-11-21 MED ORDER — FENTANYL CITRATE (PF) 100 MCG/2ML IJ SOLN: 25.0000 ug | INTRAMUSCULAR | Status: DC | PRN

## 2017-11-21 MED ORDER — SCOPOLAMINE 1 MG/3DAYS TD PT72
1.0000 | MEDICATED_PATCH | TRANSDERMAL | Status: DC
  Administered 2017-11-21: 1.5 mg via TRANSDERMAL

## 2017-11-21 MED ORDER — BUPIVACAINE HCL (PF) 0.5 % IJ SOLN
INTRAMUSCULAR | Status: AC
  Filled 2017-11-21: qty 30

## 2017-11-21 MED ORDER — LIDOCAINE HCL 1 % IJ SOLN
INTRAMUSCULAR | Status: DC | PRN
  Administered 2017-11-21: 9 mL via INTRAMUSCULAR

## 2017-11-21 MED ORDER — SUGAMMADEX SODIUM 200 MG/2ML IV SOLN
INTRAVENOUS | Status: DC | PRN
  Administered 2017-11-21: 149.6 mg via INTRAVENOUS

## 2017-11-21 MED ORDER — FENTANYL CITRATE (PF) 100 MCG/2ML IJ SOLN
INTRAMUSCULAR | Status: AC
  Filled 2017-11-21: qty 2

## 2017-11-21 MED ORDER — MIDAZOLAM HCL 2 MG/2ML IJ SOLN
INTRAMUSCULAR | Status: AC
  Filled 2017-11-21: qty 2

## 2017-11-21 MED ORDER — SUCCINYLCHOLINE CHLORIDE 20 MG/ML IJ SOLN
INTRAMUSCULAR | Status: AC
  Filled 2017-11-21: qty 1

## 2017-11-21 MED ORDER — KETOROLAC TROMETHAMINE 30 MG/ML IJ SOLN
INTRAMUSCULAR | Status: AC
  Filled 2017-11-21: qty 1

## 2017-11-21 MED ORDER — ACETAMINOPHEN 325 MG PO TABS: 650.0000 mg | ORAL_TABLET | Freq: Three times a day (TID) | ORAL | 0 refills | Status: AC | PRN

## 2017-11-21 MED ORDER — TRAMADOL HCL 50 MG PO TABS: 50.0000 mg | ORAL_TABLET | Freq: Four times a day (QID) | ORAL | 0 refills | Status: AC | PRN

## 2017-11-21 MED ORDER — LIDOCAINE HCL (PF) 1 % IJ SOLN
INTRAMUSCULAR | Status: AC
  Filled 2017-11-21: qty 2

## 2017-11-21 MED ORDER — VANCOMYCIN HCL IN DEXTROSE 1-5 GM/200ML-% IV SOLN
INTRAVENOUS | Status: AC
  Administered 2017-11-21: 1000 mg via INTRAVENOUS
  Filled 2017-11-21: qty 200

## 2017-11-21 SURGICAL SUPPLY — 42 items
APPLIER CLIP 11 MED OPEN (CLIP)
BLADE SURG 15 STRL LF DISP TIS (BLADE) ×1 IMPLANT
BLADE SURG 15 STRL SS (BLADE) ×2
CANISTER SUCT 1200ML W/VALVE (MISCELLANEOUS) ×3 IMPLANT
CHLORAPREP W/TINT 26ML (MISCELLANEOUS) ×3 IMPLANT
CLIP APPLIE 11 MED OPEN (CLIP) IMPLANT
CNTNR SPEC 2.5X3XGRAD LEK (MISCELLANEOUS) ×1
CONT SPEC 4OZ STER OR WHT (MISCELLANEOUS) ×2
CONTAINER SPEC 2.5X3XGRAD LEK (MISCELLANEOUS) ×1 IMPLANT
COVER PROBE FLX POLY STRL (MISCELLANEOUS) ×3 IMPLANT
DERMABOND ADVANCED (GAUZE/BANDAGES/DRESSINGS) ×2
DERMABOND ADVANCED .7 DNX12 (GAUZE/BANDAGES/DRESSINGS) ×1 IMPLANT
DEVICE DUBIN SPECIMEN MAMMOGRA (MISCELLANEOUS) ×3 IMPLANT
DRAPE LAPAROTOMY TRNSV 106X77 (MISCELLANEOUS) ×3 IMPLANT
DRAPE SHEET LG 3/4 BI-LAMINATE (DRAPES) ×3 IMPLANT
ELECT CAUTERY BLADE TIP 2.5 (TIP) ×3
ELECT REM PT RETURN 9FT ADLT (ELECTROSURGICAL) ×3
ELECTRODE CAUTERY BLDE TIP 2.5 (TIP) ×1 IMPLANT
ELECTRODE REM PT RTRN 9FT ADLT (ELECTROSURGICAL) ×1 IMPLANT
GAUZE SPONGE 4X4 12PLY STRL (GAUZE/BANDAGES/DRESSINGS) ×3 IMPLANT
GLOVE BIOGEL PI IND STRL 7.0 (GLOVE) ×1 IMPLANT
GLOVE BIOGEL PI INDICATOR 7.0 (GLOVE) ×2
GLOVE SURG SYN 6.5 ES PF (GLOVE) ×6 IMPLANT
GOWN STRL REUS W/ TWL LRG LVL3 (GOWN DISPOSABLE) ×3 IMPLANT
GOWN STRL REUS W/TWL LRG LVL3 (GOWN DISPOSABLE) ×6
JACKSON PRATT 10 (INSTRUMENTS) IMPLANT
KIT TURNOVER KIT A (KITS) ×3 IMPLANT
LABEL OR SOLS (LABEL) ×3 IMPLANT
NEEDLE HYPO 25X1 1.5 SAFETY (NEEDLE) ×6 IMPLANT
PACK BASIN MINOR ARMC (MISCELLANEOUS) ×3 IMPLANT
SUT MNCRL 4-0 (SUTURE) ×4
SUT MNCRL 4-0 27XMFL (SUTURE) ×2
SUT SILK 2 0 (SUTURE)
SUT SILK 2 0 SH (SUTURE) ×3 IMPLANT
SUT SILK 2-0 30XBRD TIE 12 (SUTURE) IMPLANT
SUT SILK 3 0 12 30 (SUTURE) IMPLANT
SUT VIC AB 3-0 SH 27 (SUTURE) ×4
SUT VIC AB 3-0 SH 27X BRD (SUTURE) ×2 IMPLANT
SUTURE MNCRL 4-0 27XMF (SUTURE) ×2 IMPLANT
SYR 10ML LL (SYRINGE) ×3 IMPLANT
SYR 50ML LL SCALE MARK (SYRINGE) IMPLANT
WATER STERILE IRR 1000ML POUR (IV SOLUTION) ×3 IMPLANT

## 2017-11-21 NOTE — Brief Op Note (Signed)
11/21/2017  1:35 PM  PATIENT:  Gail Rivas  62 y.o. female  PRE-OPERATIVE DIAGNOSIS:  CARCINOMA OF UPPER OUTER QUADRANT  LEFT BREAST, DUCTAL CARCINOMA IN SITU LEFT BREAST  POST-OPERATIVE DIAGNOSIS:  CARCINOMA OF UPPER OUTER QUADRANT  LEFT BREAST, DUCTAL CARCINOMA IN SITU LEFT BREAST  PROCEDURE:  Procedure(s): PARTIAL MASTECTOMY WITH NEEDLE LOCALIZATION AND AXILLARY SENTINEL LYMPH NODE BX (Left)  SURGEON:  Surgeon(s) and Role:    * Shandy Vi, DO - Primary  PHYSICIAN ASSISTANT:   ASSISTANTS: none   ANESTHESIA:   general  EBL:  25 mL   BLOOD ADMINISTERED:none  DRAINS: none   LOCAL MEDICATIONS USED:  MARCAINE    and LIDOCAINE   SPECIMEN:  Source of Specimen:  superior and deep border and Lumpectomy with SLN  DISPOSITION OF SPECIMEN:  PATHOLOGY  COUNTS:  YES  TOURNIQUET:  * No tourniquets in log *  DICTATION: .Note written in EPIC  PLAN OF CARE: Discharge to home after PACU  PATIENT DISPOSITION:  PACU - hemodynamically stable.   Delay start of Pharmacological VTE agent (>24hrs) due to surgical blood loss or risk of bleeding: not applicable

## 2017-11-21 NOTE — Op Note (Signed)
Preoperative diagnosis:  left breast carcinoma. ER/PR + HER2 -  Postoperative diagnosis: same.   Procedure: left needle-localized breast lumpectomy.                      left Axillary Sentinel Lymph node biopsy  Anesthesia: GETA  Surgeon: Dr. Benjamine Sprague  Wound Classification: Clean  Indications: Patient is a 62 y.o. female with a nonpalpable left breast mass noted on mammography with core biopsy demonstrating  left breast carcinoma. ER/PR + HER2 - requires needle-localized lumpectomy for treatment with sentinel lymph node biopsy.   Specimen: left Breast mass, Sentinel Lymph nodes x 2, superior and deep margins  Complications: None  Estimated Blood Loss: 35m  Findings: 1. Specimen mammography shows marker and wire on specimen 2. Pathology call refers gross examination of margins was positive, therefore additoinal superior and deep margins excised 3. No other palpable mass or lymph node identified.   Description of procedure: Preoperative needle localization was performed by radiology. In the nuclear medicine suite, the subareolar region was injected with Tc-99 sulfur colloid. Localization studies were reviewed. The patient was taken to the operating room and placed supine on the operating table, and after general anesthesia the left breast and axilla were prepped and draped in the usual sterile fashion. A time-out was completed verifying correct patient, procedure, site, positioning, and implant(s) and/or special equipment prior to beginning this procedure.  By comparing the localization studies with the direction and skin entry site of the needle, the probable trajectory and location of the mass was visualized. A circumareolar skin incision was planned in such a way as to minimize the amount of dissection to reach the mass.  The skin incision was made after infusion of local. Flaps were raised and the location of the wire confirmed. The wire was delivered into the wound. Sharp and blunt  dissection was then taken down to the mass in left outer quadrant, measuring 1cm x 1.5cm x 1cm, taking care to include the entire localizing needle and a margin of grossly normal tissue. The specimen and entire localizing wire were removed. The specimen was oriented with long lateral, short superior, deep double sutures and sent to radiology with the localization studies. Rads confirmed clip in place.  Received information from path that deep and posterior margins involved tumor, so additional margins taken and marked in same manner.  Second call confirmed additional margins were clear.   A hand-held gamma probe was used to identify the location of the hottest spot in the axilla. An incision was made around the caudal axillary hairline. Sharp and blunt Dissection was carried down to subdermal facias. The probe was placed within wound and again, the point of maximal count was found. Dissection continue until nodule was identified. The probe was placed in contact with the node and over 4000 counts were recorded. The node was excised in its entirety. Ex vivo, the node measured over 40,000 counts/10sec when placed on the probe. The bed of the node measured <100 counts. An additional hot spot was detected and the node was excised in similar fashion.  The probe was placed in contact with the node and over 5000 counts were recorded. The node was excised in its entirety. Ex vivo, the node measured over 40,000 counts/10sec when placed on the probe. The bed of the node measured <100 counts afterwards.  These two nodes were sent to pathology.  No additional hot spots were identified. No clinically abnormal nodes were palpated. Both wounds irrigated,  hemostasis was achieved and the wound closed in layers with  interrupted sutures of 3-0 Vicryl in deep dermal layer and a running subcuticular suture of Monocryl 4-0, then dressed with dermabond. The patient tolerated the procedure well and was taken to the postanesthesia care  unit in stable condition. Sponge and instrument count correct at end of procedure.

## 2017-11-21 NOTE — Interval H&P Note (Signed)
History and Physical Interval Note:  11/21/2017 10:31 AM  Gail Rivas  has presented today for surgery, with the diagnosis of CARCINOMA OF UPPER OUTER QUADRANT  LEFT BREAST, DUCTAL CARCINOMA IN SITU LEFT BREAST  The various methods of treatment have been discussed with the patient and family. After consideration of risks, benefits and other options for treatment, the patient has consented to  Procedure(s): PARTIAL MASTECTOMY WITH NEEDLE LOCALIZATION AND AXILLARY SENTINEL LYMPH NODE BX (Left) as a surgical intervention .  The patient's history has been reviewed, patient examined, no change in status, stable for surgery.  I have reviewed the patient's chart and labs.  Questions were answered to the patient's satisfaction.     Gail Rivas

## 2017-11-21 NOTE — Anesthesia Procedure Notes (Signed)
Procedure Name: Intubation Date/Time: 11/21/2017 11:06 AM Performed by: Johnna Acosta, CRNA Pre-anesthesia Checklist: Patient identified, Emergency Drugs available, Suction available, Patient being monitored and Timeout performed Patient Re-evaluated:Patient Re-evaluated prior to induction Oxygen Delivery Method: Circle system utilized Preoxygenation: Pre-oxygenation with 100% oxygen Induction Type: IV induction, Rapid sequence and Cricoid Pressure applied Laryngoscope Size: Miller and 2 Grade View: Grade I Tube type: Oral Tube size: 7.0 mm Number of attempts: 1 Airway Equipment and Method: Stylet Placement Confirmation: ETT inserted through vocal cords under direct vision,  positive ETCO2 and breath sounds checked- equal and bilateral Secured at: 21 cm Tube secured with: Tape Dental Injury: Teeth and Oropharynx as per pre-operative assessment

## 2017-11-21 NOTE — Anesthesia Preprocedure Evaluation (Addendum)
Anesthesia Evaluation  Patient identified by MRN, date of birth, ID band Patient awake    Reviewed: Allergy & Precautions, H&P , NPO status , Patient's Chart, lab work & pertinent test results  History of Anesthesia Complications (+) PONV and history of anesthetic complications  Airway Mallampati: III  TM Distance: >3 FB Neck ROM: full    Dental  (+) Missing   Pulmonary neg pulmonary ROS, sleep apnea , neg COPD,    breath sounds clear to auscultation       Cardiovascular hypertension, (-) CAD, (-) Past MI and (-) CABG negative cardio ROS  (-) dysrhythmias  Rhythm:regular Rate:Normal     Neuro/Psych  Headaches, Anxiety negative neurological ROS  negative psych ROS   GI/Hepatic negative GI ROS, Neg liver ROS, GERD  ,  Endo/Other  negative endocrine ROSdiabetes  Renal/GU negative Renal ROS  negative genitourinary   Musculoskeletal   Abdominal   Peds  Hematology negative hematology ROS (+)   Anesthesia Other Findings Past Medical History: No date: Anxiety No date: Cancer ([place]) No date: Diabetes mellitus without complication (HCC) No date: Fibrocystic disease of breast No date: GERD (gastroesophageal reflux disease) No date: Headache No date: Hyperlipidemia No date: Hypertension No date: PONV (postoperative nausea and vomiting)     Comment:  Colonoscopy No date: Sleep apnea  Past Surgical History: 10/30/2017: BREAST BIOPSY; Left     Comment:  left breast  stereo path penidng coil clip No date: BREAST CYST ASPIRATION; Left No date: COLONOSCOPY 05/10/2017: COLONOSCOPY WITH PROPOFOL; N/A     Comment:  Procedure: COLONOSCOPY WITH PROPOFOL;  Surgeon: Manya Silvas, MD;  Location: [facility] ENDOSCOPY;  Service:               Endoscopy;  Laterality: N/A; No date: wisdom teeth removal  BMI    Body Mass Index:  27.44 kg/m      Reproductive/Obstetrics negative OB ROS                             Anesthesia Physical Anesthesia Plan  ASA: II  Anesthesia Plan: General   Post-op Pain Management:    Induction:   PONV Risk Score and Plan: 4 or greater and Ondansetron, Dexamethasone, TIVA and Scopolamine patch - Pre-op  Airway Management Planned:   Additional Equipment:   Intra-op Plan:   Post-operative Plan:   Informed Consent: I have reviewed the patients History and Physical, chart, labs and discussed the procedure including the risks, benefits and alternatives for the proposed anesthesia with the patient or authorized representative who has indicated his/her understanding and acceptance.   Dental Advisory Given  Plan Discussed with: Anesthesiologist, CRNA and Surgeon  Anesthesia Plan Comments:        Anesthesia Quick Evaluation

## 2017-11-21 NOTE — Anesthesia Postprocedure Evaluation (Signed)
Anesthesia Post Note  Patient: Gail Rivas  Procedure(s) Performed: PARTIAL MASTECTOMY WITH NEEDLE LOCALIZATION AND AXILLARY SENTINEL LYMPH NODE BX (Left )  Patient location during evaluation: PACU Anesthesia Type: General Level of consciousness: awake and alert Pain management: pain level controlled Vital Signs Assessment: post-procedure vital signs reviewed and stable Respiratory status: spontaneous breathing, nonlabored ventilation, respiratory function stable and patient connected to face mask oxygen Cardiovascular status: blood pressure returned to baseline and stable Postop Assessment: no apparent nausea or vomiting Anesthetic complications: no     Last Vitals:  Vitals:   11/21/17 1348 11/21/17 1403  BP: 105/68 118/69  Pulse: 71 65  Resp: 18 16  Temp:    SpO2: 97% 94%    Last Pain:  Vitals:   11/21/17 1403  TempSrc:   PainSc: 0-No pain                 Durenda Hurt

## 2017-11-21 NOTE — Discharge Instructions (Addendum)
° °Breast Biopsy, Care After °These instructions give you information about caring for yourself after your procedure. Your doctor may also give you more specific instructions. Call your doctor if you have any problems or questions after your procedure. °Follow these instructions at home: °Medicines °· Take over-the-counter and prescription medicines only as told by your doctor. °· Do not drive for 24 hours if you received a sedative. °· Do not drink alcohol while taking pain medicine. °· Do not drive or use heavy machinery while taking prescription pain medicine. °Biopsy Site Care ° °· Follow instructions from your doctor about how to take care of your cut from surgery (incision) or puncture area. Make sure you: °? Wash your hands with soap and water before you change your bandage. If you cannot use soap and water, use hand sanitizer. °? Change any bandages (dressings) as told by your doctor. °? Leave any stitches (sutures), skin glue, or skin tape (adhesive) strips in place. They may need to stay in place for 2 weeks or longer. If tape strips get loose and curl up, you may trim the loose edges. Do not remove tape strips completely unless your doctor says it is okay. °· If you have stitches, keep them dry when you take a bath or a shower. °· Check your cut or puncture area every day for signs of infection. Check for: °? More redness, swelling, or pain. °? More fluid or blood. °? Warmth. °? Pus or a bad smell. °· Protect the biopsy area. Do not let the area get bumped. °Activity °· Avoid activities that could pull the biopsy site open. °? Avoid stretching. °? Avoid reaching. °? Avoid exercise. °? Avoid sports. °? Avoid lifting anything that is heavier than 3 pounds (1.4 kg). °· Return to your normal activities as told by your doctor. Ask your doctor what activities are safe for you. °General instructions °· Continue your normal diet. °· Wear a good support bra for as long as told by your doctor. °· Get checked for  extra fluid in your body (lymphedema) as often as told by your doctor. °· Keep all follow-up visits as told by your doctor. This is important. °Contact a health care provider if: °· You have more redness, swelling, or pain at the biopsy site. °· You have more fluid or blood coming from your biopsy site. °· Your biopsy site feels warm to the touch. °· You have pus or a bad smell coming from the biopsy site. °· Your biopsy site breaks open after the stitches, staples, or skin tape strips have been removed. °· You have a rash. °· You have a fever. °Get help right away if: °· You have more bleeding (more than a small spot) from the biopsy site. °· You have trouble breathing. °· You have red streaks around the biopsy site. °This information is not intended to replace advice given to you by your health care provider. Make sure you discuss any questions you have with your health care provider. °Document Released: 01/06/2009 Document Revised: 11/17/2015 Document Reviewed: 12/14/2014 °Elsevier Interactive Patient Education © 2018 [employer] °AMBULATORY SURGERY  °DISCHARGE INSTRUCTIONS ° ° °1) The drugs that you were given will stay in your system until tomorrow so for the next 24 hours you should not: ° °A) Drive an automobile °B) Make any legal decisions °C) Drink any alcoholic beverage ° ° °2) You may resume regular meals tomorrow.  Today it is better to start with liquids and gradually work up to solid   foods. ° °You may eat anything you prefer, but it is better to start with liquids, then soup and crackers, and gradually work up to solid foods. ° ° °3) Please notify your doctor immediately if you have any unusual bleeding, trouble breathing, redness and pain at the surgery site, drainage, fever, or pain not relieved by medication. ° ° ° °4) Additional Instructions: ° ° ° ° ° ° ° °Please contact your physician with any problems or Same Day Surgery at [phone], Monday through Friday 6 am to 4 pm, or [place] at  [place] Main number at [phone]. °

## 2017-11-21 NOTE — Anesthesia Post-op Follow-up Note (Signed)
Anesthesia QCDR form completed.        

## 2017-11-21 NOTE — Transfer of Care (Signed)
Immediate Anesthesia Transfer of Care Note  Patient: Gail Rivas  Procedure(s) Performed: PARTIAL MASTECTOMY WITH NEEDLE LOCALIZATION AND AXILLARY SENTINEL LYMPH NODE BX (Left )  Patient Location: PACU  Anesthesia Type:General  Level of Consciousness: sedated  Airway & Oxygen Therapy: Patient Spontanous Breathing and Patient connected to face mask oxygen  Post-op Assessment: Report given to RN and Post -op Vital signs reviewed and stable  Post vital signs: Reviewed and stable  Last Vitals:  Vitals Value Taken Time  BP 107/66 11/21/2017  1:33 PM  Temp 37.3 C 11/21/2017  1:33 PM  Pulse 73 11/21/2017  1:36 PM  Resp 20 11/21/2017  1:36 PM  SpO2 96 % 11/21/2017  1:36 PM  Vitals shown include unvalidated device data.  Last Pain:  Vitals:   11/21/17 0938  TempSrc: Oral  PainSc: 0-No pain         Complications: No apparent anesthesia complications

## 2017-11-22 ENCOUNTER — Encounter: Payer: Self-pay | Admitting: Surgery

## 2017-11-23 LAB — SURGICAL PATHOLOGY

## 2017-11-28 ENCOUNTER — Other Ambulatory Visit: Payer: Self-pay | Admitting: *Deleted

## 2017-12-04 ENCOUNTER — Encounter: Payer: Self-pay | Admitting: *Deleted

## 2017-12-04 NOTE — Progress Notes (Signed)
  Oncology Nurse Navigator Documentation  Navigator Location: [facility] (12/04/17 0800)   )Navigator Encounter Type: Telephone (12/04/17 0800) Telephone: [facility] Call (12/04/17 0800)                       Barriers/Navigation Needs: Education;Coordination of Care (12/04/17 0800) Education: Coping with Diagnosis/ Prognosis (12/04/17 0800) Interventions: Coordination of Care;Education (12/04/17 0800)   Coordination of Care: Appts (12/04/17 0800) Education Method: Verbal (12/04/17 0800)                Time Spent with Patient: 15 (12/04/17 0800)   Patients husband called and states the patient is very upset and cried for a couple of hours last night because we had scheduled her to return to see Dr. Tasia Catchings after surgery.  Patient did not remember that we had discussed returning after surgery at the initial appointment.  He also states she did not have the final path results from surgery and was afraid that she had positive lymph nodes.  I did inform the husband to please tell her her lymph nodes were negative, and this appointment is just a routine follow-up post surgery to discuss final treatment planning. Patient will come in tomorrow.

## 2017-12-05 ENCOUNTER — Other Ambulatory Visit: Payer: Self-pay

## 2017-12-05 ENCOUNTER — Encounter: Payer: Self-pay | Admitting: Oncology

## 2017-12-05 ENCOUNTER — Inpatient Hospital Stay: Payer: BC Managed Care – PPO | Attending: Oncology | Admitting: Oncology

## 2017-12-05 VITALS — BP 133/81 | HR 70 | Temp 97.2°F | Resp 16 | Wt 162.0 lb

## 2017-12-05 DIAGNOSIS — Z8041 Family history of malignant neoplasm of ovary: Secondary | ICD-10-CM | POA: Diagnosis not present

## 2017-12-05 DIAGNOSIS — C50412 Malignant neoplasm of upper-outer quadrant of left female breast: Secondary | ICD-10-CM | POA: Insufficient documentation

## 2017-12-05 DIAGNOSIS — Z170 Estrogen receptor positive status [ER+]: Secondary | ICD-10-CM | POA: Insufficient documentation

## 2017-12-05 DIAGNOSIS — Z800 Family history of malignant neoplasm of digestive organs: Secondary | ICD-10-CM | POA: Insufficient documentation

## 2017-12-05 DIAGNOSIS — Z79811 Long term (current) use of aromatase inhibitors: Secondary | ICD-10-CM

## 2017-12-05 NOTE — Progress Notes (Signed)
Patient here today for follow up post left breast surgery.

## 2017-12-05 NOTE — Progress Notes (Signed)
Hematology/Oncology Consult note [facility] Telephone:([phone] [phone]   Patient Care Team: Idelle Crouch, MD as PCP - General (Internal Medicine)  REFERRING PROVIDER: Idelle Crouch, MD CHIEF COMPLAINTS/REASON FOR VISIT:  Evaluation of breast cancer  HISTORY OF PRESENTING ILLNESS:  Gail Rivas is a  62 y.o.  female with PMH listed below who was referred to me for evaluation of breast cancer. Patient had screening mammogram and ultrasound on 10/15/2017 which showed left breast possible mass warrant further evaluation. Diagnostic breast mammogram on 7/31 2019 showed 6 mm spiculated mass slight distortion in the outer left breast.  Targeted ultrasound was performed showing no definite sonographic correlate is identified in the left breast.  Left axilla is negative for lymphadenopathy.   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  Father had colon cancer. History of radiation to chest: denies.   INTERVAL HISTORY Gail Rivas is a 62 y.o. female who has above history reviewed by me today presents for follow up visit for management of breast cancer.  # 11/21/2017 She is s/p lumpectomy and sentinel lymph node biopsy.  Pathology showed 46m invasive mammary carcinoma, grade 1, sentinel lymph node was negative.   Patient reports focal soreness at surgical sites. Wound heals well, no discharge.  Her case was discussed at tumor board. Consensus was  No chemotherapy needed. adjuvant RT followed by anti estrogen treatment.    Review of Systems  Constitutional: Negative for chills, fever, malaise/fatigue and weight loss.  HENT: Negative for nosebleeds and sore throat.   Eyes: Negative for double vision, photophobia and  redness.  Respiratory: Negative for cough, shortness of breath and wheezing.   Cardiovascular: Negative for chest pain, palpitations and orthopnea.  Gastrointestinal: Negative for abdominal pain, blood in stool, nausea and vomiting.  Genitourinary: Negative for dysuria.  Musculoskeletal: Negative for back pain, myalgias and neck pain.  Skin: Negative for itching and rash.  Neurological: Negative for dizziness, tingling and tremors.  Endo/Heme/Allergies: Negative for environmental allergies. Does not bruise/bleed easily.  Psychiatric/Behavioral: Negative for depression. The patient is nervous/anxious.     MEDICAL HISTORY:  Past Medical History:  Diagnosis Date  . Anxiety   . [place] cancer ([place] 10[place]2017   invasive mammary carcinoma  . Diabetes mellitus without complication ([place]   . Fibrocystic disease of breast   . GERD (gastroesophageal reflux disease)   . Headache   . Hyperlipidemia   . Hypertension   . PONV (postoperative nausea and vomiting)    Colonoscopy  . Sleep apnea     SURGICAL HISTORY: Past Surgical History:  Procedure Laterality Date  . BREAST BIOPSY Left 10/30/2017   invasive mammary carcinoma  . BREAST CYST ASPIRATION Left   . BREAST LUMPECTOMY Left 11/21/2017   invasive mammary carcinoma   . COLONOSCOPY    . COLONOSCOPY WITH PROPOFOL N/A 05/10/2017   Procedure: COLONOSCOPY WITH PROPOFOL;  Surgeon: EManya Silvas MD;  [facility];  Service: Endoscopy;  Laterality: N/A;  . PARTIAL MASTECTOMY WITH NEEDLE LOCALIZATION AND AXILLARY SENTINEL LYMPH NODE BX Left 11/21/2017   Procedure: PARTIAL MASTECTOMY WITH NEEDLE LOCALIZATION AND AXILLARY SENTINEL LYMPH NODE BX;  Surgeon: SBenjamine Sprague DO;  Location: [facility] ORS;  Service: General;  Laterality: Left;  . wisdom teeth removal      SOCIAL HISTORY: Social  History   Socioeconomic History  . Marital status: Married    Spouse name: Not on file  . Number of children: Not on file  . Years of education:  Not on file  . Highest education level: Not on file  Occupational History  . Not on file  Social Needs  . Financial resource strain: Not on file  . Food insecurity:    Worry: Not on file    Inability: Not on file  . Transportation needs:    Medical: Not on file    Non-medical: Not on file  Tobacco Use  . Smoking status: Never Smoker  . Smokeless tobacco: Never Used  Substance and Sexual Activity  . Alcohol use: No    Frequency: Never  . Drug use: No  . Sexual activity: Yes  Lifestyle  . Physical activity:    Days per week: Not on file    Minutes per session: Not on file  . Stress: Not on file  Relationships  . Social connections:    Talks on phone: Not on file    Gets together: Not on file    Attends religious service: Not on file    Active member of club or organization: Not on file    Attends meetings of clubs or organizations: Not on file    Relationship status: Not on file  . Intimate partner violence:    Fear of current or ex partner: Not on file    Emotionally abused: Not on file    Physically abused: Not on file    Forced sexual activity: Not on file  Other Topics Concern  . Not on file  Social History Narrative  . Not on file    FAMILY HISTORY: Family History  Problem Relation Age of Onset  . Hypertension Mother   . Colon cancer Father   . Liver cancer Father   . Diabetes Father   . Ovarian cancer Maternal Grandmother   . Breast cancer Neg Hx     ALLERGIES:  is allergic to codeine; sulfa antibiotics; versed [midazolam]; and penicillin v.  MEDICATIONS:  Current Outpatient Medications  Medication Sig Dispense Refill  . acetaminophen (TYLENOL) 325 MG tablet Take 2 tablets (650 mg total) by mouth every 8 (eight) hours as needed for mild pain. 40 tablet 0  . cetirizine (ZYRTEC) 10 MG tablet Take 10 mg by mouth daily.    . clotrimazole-betamethasone (LOTRISONE) cream Apply 1 application topically daily as needed (eczema).     Marland Kitchen glimepiride (AMARYL) 4  MG tablet Take 4 mg by mouth 2 (two) times daily.    . Lactobacillus Rhamnosus, GG, ([employer] PROBIOTIC, LACTOBACILLUS, PO) Take 1 capsule by mouth daily as needed (takes with acidic foods).     . metFORMIN (GLUCOPHAGE) 1000 MG tablet Take 1,000 mg by mouth 2 (two) times daily with a meal.    . metoprolol (TOPROL-XL) 200 MG 24 hr tablet Take 200 mg by mouth daily.    . pioglitazone (ACTOS) 45 MG tablet Take 45 mg by mouth daily.    . sitaGLIPtin (JANUVIA) 100 MG tablet Take 100 mg by mouth daily.    Marland Kitchen telmisartan-hydrochlorothiazide (MICARDIS HCT) 80-25 MG tablet Take 1 tablet by mouth daily. Take 1 tablet by mouth daily      No current facility-administered medications for this visit.      PHYSICAL EXAMINATION: ECOG PERFORMANCE STATUS: 0 - Asymptomatic Vitals:   12/05/17 1433  BP: 133/81  Pulse: 70  Resp: 16  Temp: (!) 97.2  F (36.2 C)   Filed Weights   12/05/17 1433  Weight: 162 lb (73.5 kg)    Physical Exam  Constitutional: She is oriented to person, place, and time. She appears well-developed and well-nourished. No distress.  HENT:  Head: Normocephalic and atraumatic.  Right Ear: External ear normal.  Left Ear: External ear normal.  Mouth/Throat: Oropharynx is clear and moist.  Eyes: Pupils are equal, round, and reactive to light. EOM are normal. No scleral icterus.  Neck: Normal range of motion. Neck supple.  Cardiovascular: Normal rate, regular rhythm and normal heart sounds.  Pulmonary/Chest: Effort normal. No respiratory distress. She has no wheezes.  Abdominal: Soft. Bowel sounds are normal. She exhibits no distension and no mass. There is no tenderness.  Musculoskeletal: Normal range of motion. She exhibits no edema or deformity.  Neurological: She is alert and oriented to person, place, and time. No cranial nerve deficit. Coordination normal.  Skin: Skin is warm and dry. No rash noted. No erythema.  Psychiatric: She has a normal mood and affect. Her behavior is normal.  Thought content normal.  Breast exam was performed in seated and lying down position. Patient is status post lumpectomy with a healing surgical scar. No erythema or discharge.      LABORATORY DATA:  I have reviewed the data as listed Lab Results  Component Value Date   WBC 8.0 11/06/2017   HGB 14.2 11/06/2017   HCT 41.8 11/06/2017   MCV 91.5 11/06/2017   PLT 190 11/06/2017   Recent Labs    11/06/17 1546  NA 139  K 3.8  CL 103  CO2 23  GLUCOSE 138*  BUN 17  CREATININE 0.80  CALCIUM 9.4  GFRNONAA >60  GFRAA >60  PROT 7.4  ALBUMIN 4.6  AST 28  ALT 30  ALKPHOS 59  BILITOT 1.4*   Iron/TIBC/Ferritin/ %Sat No results found for: IRON, TIBC, FERRITIN, IRONPCTSAT      ASSESSMENT & PLAN:  1. Malignant neoplasm of upper-outer quadrant of left breast in female, estrogen receptor positive ([place])   2. Aromatase inhibitor use   Cancer Staging Malignant neoplasm of upper-outer quadrant of left breast in female, estrogen receptor positive ([place]) Staging form: Breast, AJCC 8th Edition - Clinical: No stage assigned - Unsigned - Pathologic stage from 12/05/2017: Stage IA (pT1a, pN0, cM0, G1, ER+, PR+, HER2-) - Signed by Earlie Server, MD on 12/05/2017   # Breast Cancer, pT1a N0, stage 1A ER/PR +, HER2 negative.  Pathology report was discussed with patient.  The diagnosis and care plan were discussed with patient in detail.  NCCN guidelines were reviewed and shared with patient. Will not offer adjuvant chemotherapy.  Recommend patient to establish care with RadOnc for adjuvant RT.  Once she finishes RT,recommend patient to start on adjuvant aromatase inhibitor.  Rationale and side effects of AI discussed with patient.  Recommend baseline bone density.  We spent sufficient time to discuss many aspect of care, questions were answered to patient's satisfaction.  Will meet patient after she finishes RT for assessment prior to starting adjuvant anti estrogen treatment.   #Family history  of ovarian cancer, personal history of breast cancer recommend genetic testing.  She has been referred to Dietitian.   We spent sufficient time to discuss many aspect of care, questions were answered to patient's satisfaction.  Orders Placed This Encounter  Procedures  . DG Bone Density    Standing Status:   Future    Standing Expiration Date:   12/05/2018  Order Specific Question:   Reason for Exam (SYMPTOM  OR DIAGNOSIS REQUIRED)    Answer:   Aromatase inhibitor baseline    Order Specific Question:   Preferred imaging location?    Answer:   [facility]  . CBC with Differential/Platelet    Standing Status:   Future    Standing Expiration Date:   12/06/2018  . Comprehensive metabolic panel    Standing Status:   Future    Standing Expiration Date:   12/06/2018  . Ambulatory referral to Radiation Oncology    Referral Priority:   Routine    Referral Type:   Consultation    Referral Reason:   Specialty Services Required    Referred to Provider:   Noreene Filbert, MD    Requested Specialty:   Radiation Oncology    Number of Visits Requested:   1    All questions were answered. The patient knows to call the clinic with any problems questions or concerns.  Return of visit: 5 weeks.  Total face to face encounter time for this patient visit was 45 min. >50% of the time was  spent in counseling and coordination of care.  Earlie Server, MD, PhD Hematology Oncology [facility] at [facility] Pager- [phone] 12/05/2017

## 2017-12-17 ENCOUNTER — Ambulatory Visit
Admission: RE | Admit: 2017-12-17 | Discharge: 2017-12-17 | Disposition: A | Discharge status: Home / Self Care | Payer: BC Managed Care – PPO | Source: Ambulatory Visit | Attending: Radiation Oncology | Admitting: Radiation Oncology

## 2017-12-17 ENCOUNTER — Encounter: Payer: Self-pay | Admitting: Radiation Oncology

## 2017-12-17 ENCOUNTER — Other Ambulatory Visit: Payer: Self-pay

## 2017-12-17 VITALS — BP 125/77 | HR 72 | Temp 97.4°F | Resp 16 | Wt 161.5 lb

## 2017-12-17 DIAGNOSIS — E785 Hyperlipidemia, unspecified: Secondary | ICD-10-CM | POA: Insufficient documentation

## 2017-12-17 DIAGNOSIS — Z79899 Other long term (current) drug therapy: Secondary | ICD-10-CM | POA: Insufficient documentation

## 2017-12-17 DIAGNOSIS — N6019 Diffuse cystic mastopathy of unspecified breast: Secondary | ICD-10-CM | POA: Diagnosis not present

## 2017-12-17 DIAGNOSIS — Z800 Family history of malignant neoplasm of digestive organs: Secondary | ICD-10-CM | POA: Diagnosis not present

## 2017-12-17 DIAGNOSIS — Z9012 Acquired absence of left breast and nipple: Secondary | ICD-10-CM | POA: Diagnosis not present

## 2017-12-17 DIAGNOSIS — Z8041 Family history of malignant neoplasm of ovary: Secondary | ICD-10-CM | POA: Diagnosis not present

## 2017-12-17 DIAGNOSIS — G4730 Sleep apnea, unspecified: Secondary | ICD-10-CM | POA: Insufficient documentation

## 2017-12-17 DIAGNOSIS — C50412 Malignant neoplasm of upper-outer quadrant of left female breast: Secondary | ICD-10-CM | POA: Diagnosis present

## 2017-12-17 DIAGNOSIS — I10 Essential (primary) hypertension: Secondary | ICD-10-CM | POA: Diagnosis not present

## 2017-12-17 DIAGNOSIS — E119 Type 2 diabetes mellitus without complications: Secondary | ICD-10-CM | POA: Diagnosis not present

## 2017-12-17 DIAGNOSIS — F419 Anxiety disorder, unspecified: Secondary | ICD-10-CM | POA: Diagnosis not present

## 2017-12-17 DIAGNOSIS — Z170 Estrogen receptor positive status [ER+]: Secondary | ICD-10-CM | POA: Insufficient documentation

## 2017-12-17 DIAGNOSIS — Z7984 Long term (current) use of oral hypoglycemic drugs: Secondary | ICD-10-CM | POA: Diagnosis not present

## 2017-12-17 DIAGNOSIS — K219 Gastro-esophageal reflux disease without esophagitis: Secondary | ICD-10-CM | POA: Insufficient documentation

## 2017-12-17 NOTE — Consult Note (Signed)
NEW PATIENT EVALUATION  Name: Gail Rivas  MRN: [id number]  Date:   12/17/2017     DOB: 04/11/1955   This 62 y.o. 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  REFERRING PHYSICIAN: Idelle Crouch, MD  CHIEF COMPLAINT:  Chief Complaint  Patient presents with  . Breast Cancer    Pt is here for initial consultation of breast cancer    DIAGNOSIS: The encounter diagnosis was Malignant neoplasm of upper-outer quadrant of left breast in female, estrogen receptor positive ([place]).   PREVIOUS INVESTIGATIONS:  Mammograms ultrasound reviewed Pathology reports reviewed Clinical notes reviewed  HPI: patient is a 62 year old female who presented with an abnormal mammogram and ultrasound in July 2019.at the time of initial mammogram the was a 6 no meter spicular mass with architectural distortion in the outer left breast. Stereotactic biopsy was recommended and was positive for invasive mammary carcinoma.she underwent a wide local excision showing grade 1 invasive mammary carcinoma measuring 5 mm. Margins were clear at 3 mm being the closest.2 sentinel lymph nodes were negative. Tumor was ER/PR positive HER-2/neu not overexpressed. She has done well postoperatively. Based on the small size of her lesion systemic chemotherapy was not recommended. She is seen today for radiation oncology consultation.  PLANNED TREATMENT REGIMEN: left breast hypofractionated radiation therapy  PAST MEDICAL HISTORY:  has a past medical history of Anxiety, Breast cancer ([place]) (10/2017), Diabetes mellitus without complication ([place]), Fibrocystic disease of breast, GERD (gastroesophageal reflux disease), Headache, Hyperlipidemia, Hypertension, PONV (postoperative nausea and vomiting), and Sleep apnea.    PAST SURGICAL HISTORY:  Past Surgical History:  Procedure  Laterality Date  . BREAST BIOPSY Left 10/30/2017   invasive mammary carcinoma  . BREAST CYST ASPIRATION Left   . BREAST LUMPECTOMY Left 11/21/2017   invasive mammary carcinoma   . COLONOSCOPY    . COLONOSCOPY WITH PROPOFOL N/A 05/10/2017   Procedure: COLONOSCOPY WITH PROPOFOL;  Surgeon: Manya Silvas, MD;  [facility] END[facility]COPY;  Service: Endoscopy;  Laterality: N/A;  . PARTIAL MASTECTOMY WITH NEEDLE LOCALIZATION AND AXILLARY SENTINEL LYMPH NODE BX Left 11/21/2017   Procedure: PARTIAL MASTECTOMY WITH NEEDLE LOCALIZATION AND AXILLARY SENTINEL LYMPH NODE BX;  Surgeon: Benjamine Sprague, DO;  Location: [facility] ORS;  Service: General;  Laterality: Left;  . wisdom teeth removal      FAMILY HISTORY: family history includes Colon cancer in her father; Diabetes in her father; Hypertension in her mother; Liver cancer in her father; Ovarian cancer in her maternal grandmother.  SOCIAL HISTORY:  reports that she has never smoked. She has never used smokeless tobacco. She reports that she does not drink alcohol or use drugs.  ALLERGIES: Codeine; Sulfa antibiotics; Versed [midazolam]; and Penicillin v  MEDICATIONS:  Current Outpatient Medications  Medication Sig Dispense Refill  . acetaminophen (TYLENOL) 325 MG tablet Take 2 tablets (650 mg total) by mouth every 8 (eight) hours as needed for mild pain. 40 tablet 0  . cetirizine (ZYRTEC) 10 MG tablet Take 10 mg by mouth daily.    . clotrimazole-betamethasone (LOTRISONE) cream Apply 1 application topically daily as needed (eczema).     Marland Kitchen glimepiride (AMARYL) 4 MG tablet Take 4 mg by mouth 2 (two) times daily.    . Lactobacillus Rhamnosus, GG, ([employer] PROBIOTIC, LACTOBACILLUS, PO) Take 1 capsule by mouth daily as needed (takes with acidic foods).     Marland Kitchen  metFORMIN (GLUCOPHAGE) 1000 MG tablet Take 1,000 mg by mouth 2 (two) times daily with a meal.    . metoprolol (TOPROL-XL) 200 MG 24 hr tablet Take 200 mg by mouth daily.    . pioglitazone (ACTOS) 45 MG tablet  Take 45 mg by mouth daily.    . sitaGLIPtin (JANUVIA) 100 MG tablet Take 100 mg by mouth daily.    Marland Kitchen telmisartan-hydrochlorothiazide (MICARDIS HCT) 80-25 MG tablet Take 1 tablet by mouth daily. Take 1 tablet by mouth daily      No current facility-administered medications for this encounter.     ECOG PERFORMANCE STATUS:  0 - Asymptomatic  REVIEW OF SYSTEMS:  Patient denies any weight loss, fatigue, weakness, fever, chills or night sweats. Patient denies any loss of vision, blurred vision. Patient denies any ringing  of the ears or hearing loss. No irregular heartbeat. Patient denies heart murmur or history of fainting. Patient denies any chest pain or pain radiating to her upper extremities. Patient denies any shortness of breath, difficulty breathing at night, cough or hemoptysis. Patient denies any swelling in the lower legs. Patient denies any nausea vomiting, vomiting of blood, or coffee ground material in the vomitus. Patient denies any stomach pain. Patient states has had normal bowel movements no significant constipation or diarrhea. Patient denies any dysuria, hematuria or significant nocturia. Patient denies any problems walking, swelling in the joints or loss of balance. Patient denies any skin changes, loss of hair or loss of weight. Patient denies any excessive worrying or anxiety or significant depression. Patient denies any problems with insomnia. Patient denies excessive thirst, polyuria, polydipsia. Patient denies any swollen glands, patient denies easy bruising or easy bleeding. Patient denies any recent infections, allergies or URI. Patient "s visual fields have not changed significantly in recent time.    PHYSICAL EXAM: BP 125/77 (BP Location: Right Arm, Patient Position: Sitting)   Pulse 72   Temp (!) 97.4 F (36.3 C) (Tympanic)   Resp 16   Wt 161 lb 7.8 oz (73.2 kg)   BMI 26.87 kg/m  Left breast is wide local excision scar around the nipple areolar complex which is  well-healed. She also wasn't axillary sentinel node biopsy site which is well-healed no dominant mass or nodularity is noted in either breast in 2 positions examined.Well-developed well-nourished patient in NAD. HEENT reveals PERLA, EOMI, discs not visualized.  Oral cavity is clear. No oral mucosal lesions are identified. Neck is clear without evidence of cervical or supraclavicular adenopathy. Lungs are clear to A&P. Cardiac examination is essentially unremarkable with regular rate and rhythm without murmur rub or thrill. Abdomen is benign with no organomegaly or masses noted. Motor sensory and DTR levels are equal and symmetric in the upper and lower extremities. Cranial nerves II through XII are grossly intact. Proprioception is intact. No peripheral adenopathy or edema is identified. No motor or sensory levels are noted. Crude visual fields are within normal range.  LABORATORY DATA: pathology reports reviewed    RADIOLOGY RESULTS:mammogram and ultrasound reviewed and compatible with the above-stated findings   IMPRESSION: stage IA invasive mammary carcinoma of the left breast status post wide local excision and sentinel node biopsy ER/PR positive in 62 year old female  PLAN: at this time based on the patient's breast size I believe she would be a good candidate for hypofractionated course of treatment over 4 weeks. Also boost her scar another 1400 cGy using electron beam. Risks and benefits of treatment including skin reaction fatigue alteration of blood counts possible  inclusion of superficial lung all were discussed in detail with the patient. She seems to comprehend my treatment plan well. Patient also will be candidate for antiestrogen therapy after completion of radiation. I have personally set up and ordered CT simulation for later this week.  I would like to take this opportunity to thank you for allowing me to participate in the care of your patient.Noreene Filbert, MD

## 2017-12-19 ENCOUNTER — Ambulatory Visit: Payer: BC Managed Care – PPO

## 2017-12-20 ENCOUNTER — Ambulatory Visit
Admission: RE | Admit: 2017-12-20 | Discharge: 2017-12-20 | Disposition: A | Discharge status: Home / Self Care | Payer: BC Managed Care – PPO | Source: Ambulatory Visit | Attending: Radiation Oncology | Admitting: Radiation Oncology

## 2017-12-20 DIAGNOSIS — C50412 Malignant neoplasm of upper-outer quadrant of left female breast: Secondary | ICD-10-CM | POA: Diagnosis not present

## 2017-12-20 DIAGNOSIS — Z170 Estrogen receptor positive status [ER+]: Secondary | ICD-10-CM | POA: Insufficient documentation

## 2017-12-20 DIAGNOSIS — Z510 Encounter for antineoplastic radiation therapy: Secondary | ICD-10-CM | POA: Diagnosis not present

## 2017-12-23 DIAGNOSIS — C50412 Malignant neoplasm of upper-outer quadrant of left female breast: Secondary | ICD-10-CM | POA: Diagnosis not present

## 2017-12-26 ENCOUNTER — Ambulatory Visit
Admission: RE | Admit: 2017-12-26 | Discharge: 2017-12-26 | Disposition: A | Discharge status: Home / Self Care | Payer: BC Managed Care – PPO | Source: Ambulatory Visit | Attending: Oncology | Admitting: Oncology

## 2017-12-26 DIAGNOSIS — Z79811 Long term (current) use of aromatase inhibitors: Secondary | ICD-10-CM | POA: Diagnosis present

## 2017-12-26 DIAGNOSIS — C50412 Malignant neoplasm of upper-outer quadrant of left female breast: Secondary | ICD-10-CM | POA: Diagnosis not present

## 2017-12-26 DIAGNOSIS — Z170 Estrogen receptor positive status [ER+]: Secondary | ICD-10-CM | POA: Diagnosis present

## 2017-12-30 ENCOUNTER — Ambulatory Visit
Admission: RE | Admit: 2017-12-30 | Discharge: 2017-12-30 | Disposition: A | Discharge status: Home / Self Care | Payer: BC Managed Care – PPO | Source: Ambulatory Visit | Attending: Radiation Oncology | Admitting: Radiation Oncology

## 2017-12-30 DIAGNOSIS — Z510 Encounter for antineoplastic radiation therapy: Secondary | ICD-10-CM | POA: Insufficient documentation

## 2017-12-30 DIAGNOSIS — Z170 Estrogen receptor positive status [ER+]: Secondary | ICD-10-CM | POA: Insufficient documentation

## 2017-12-30 DIAGNOSIS — C50412 Malignant neoplasm of upper-outer quadrant of left female breast: Secondary | ICD-10-CM | POA: Insufficient documentation

## 2017-12-31 ENCOUNTER — Ambulatory Visit
Admission: RE | Admit: 2017-12-31 | Discharge: 2017-12-31 | Disposition: A | Discharge status: Home / Self Care | Payer: BC Managed Care – PPO | Source: Ambulatory Visit | Attending: Radiation Oncology | Admitting: Radiation Oncology

## 2017-12-31 DIAGNOSIS — C50412 Malignant neoplasm of upper-outer quadrant of left female breast: Secondary | ICD-10-CM | POA: Diagnosis not present

## 2018-01-01 ENCOUNTER — Ambulatory Visit
Admission: RE | Admit: 2018-01-01 | Discharge: 2018-01-01 | Disposition: A | Discharge status: Home / Self Care | Payer: BC Managed Care – PPO | Source: Ambulatory Visit | Attending: Radiation Oncology | Admitting: Radiation Oncology

## 2018-01-01 DIAGNOSIS — C50412 Malignant neoplasm of upper-outer quadrant of left female breast: Secondary | ICD-10-CM | POA: Diagnosis not present

## 2018-01-02 ENCOUNTER — Ambulatory Visit
Admission: RE | Admit: 2018-01-02 | Discharge: 2018-01-02 | Disposition: A | Discharge status: Home / Self Care | Payer: BC Managed Care – PPO | Source: Ambulatory Visit | Attending: Radiation Oncology | Admitting: Radiation Oncology

## 2018-01-02 DIAGNOSIS — C50412 Malignant neoplasm of upper-outer quadrant of left female breast: Secondary | ICD-10-CM | POA: Diagnosis not present

## 2018-01-03 ENCOUNTER — Ambulatory Visit
Admission: RE | Admit: 2018-01-03 | Discharge: 2018-01-03 | Disposition: A | Discharge status: Home / Self Care | Payer: BC Managed Care – PPO | Source: Ambulatory Visit | Attending: Radiation Oncology | Admitting: Radiation Oncology

## 2018-01-03 DIAGNOSIS — C50412 Malignant neoplasm of upper-outer quadrant of left female breast: Secondary | ICD-10-CM | POA: Diagnosis not present

## 2018-01-06 ENCOUNTER — Ambulatory Visit
Admission: RE | Admit: 2018-01-06 | Discharge: 2018-01-06 | Disposition: A | Discharge status: Home / Self Care | Payer: BC Managed Care – PPO | Source: Ambulatory Visit | Attending: Radiation Oncology | Admitting: Radiation Oncology

## 2018-01-06 DIAGNOSIS — C50412 Malignant neoplasm of upper-outer quadrant of left female breast: Secondary | ICD-10-CM | POA: Diagnosis not present

## 2018-01-07 ENCOUNTER — Ambulatory Visit
Admission: RE | Admit: 2018-01-07 | Discharge: 2018-01-07 | Disposition: A | Discharge status: Home / Self Care | Payer: BC Managed Care – PPO | Source: Ambulatory Visit | Attending: Radiation Oncology | Admitting: Radiation Oncology

## 2018-01-07 ENCOUNTER — Ambulatory Visit: Payer: BC Managed Care – PPO

## 2018-01-07 DIAGNOSIS — C50412 Malignant neoplasm of upper-outer quadrant of left female breast: Secondary | ICD-10-CM | POA: Diagnosis not present

## 2018-01-08 ENCOUNTER — Ambulatory Visit
Admission: RE | Admit: 2018-01-08 | Discharge: 2018-01-08 | Disposition: A | Discharge status: Home / Self Care | Payer: BC Managed Care – PPO | Source: Ambulatory Visit | Attending: Radiation Oncology | Admitting: Radiation Oncology

## 2018-01-08 DIAGNOSIS — C50412 Malignant neoplasm of upper-outer quadrant of left female breast: Secondary | ICD-10-CM | POA: Diagnosis not present

## 2018-01-09 ENCOUNTER — Other Ambulatory Visit: Payer: Self-pay

## 2018-01-09 ENCOUNTER — Ambulatory Visit
Admission: RE | Admit: 2018-01-09 | Discharge: 2018-01-09 | Disposition: A | Discharge status: Home / Self Care | Payer: BC Managed Care – PPO | Source: Ambulatory Visit | Attending: Radiation Oncology | Admitting: Radiation Oncology

## 2018-01-09 ENCOUNTER — Encounter: Payer: Self-pay | Admitting: Oncology

## 2018-01-09 ENCOUNTER — Inpatient Hospital Stay (HOSPITAL_BASED_OUTPATIENT_CLINIC_OR_DEPARTMENT_OTHER): Payer: BC Managed Care – PPO | Admitting: Oncology

## 2018-01-09 ENCOUNTER — Inpatient Hospital Stay: Payer: BC Managed Care – PPO | Attending: Oncology

## 2018-01-09 VITALS — BP 128/83 | HR 71 | Temp 96.1°F | Resp 18 | Wt 164.2 lb

## 2018-01-09 DIAGNOSIS — E119 Type 2 diabetes mellitus without complications: Secondary | ICD-10-CM | POA: Insufficient documentation

## 2018-01-09 DIAGNOSIS — Z8041 Family history of malignant neoplasm of ovary: Secondary | ICD-10-CM

## 2018-01-09 DIAGNOSIS — I10 Essential (primary) hypertension: Secondary | ICD-10-CM | POA: Insufficient documentation

## 2018-01-09 DIAGNOSIS — Z79811 Long term (current) use of aromatase inhibitors: Secondary | ICD-10-CM | POA: Diagnosis not present

## 2018-01-09 DIAGNOSIS — Z79899 Other long term (current) drug therapy: Secondary | ICD-10-CM

## 2018-01-09 DIAGNOSIS — Z7984 Long term (current) use of oral hypoglycemic drugs: Secondary | ICD-10-CM | POA: Insufficient documentation

## 2018-01-09 DIAGNOSIS — Z923 Personal history of irradiation: Secondary | ICD-10-CM | POA: Insufficient documentation

## 2018-01-09 DIAGNOSIS — C50412 Malignant neoplasm of upper-outer quadrant of left female breast: Secondary | ICD-10-CM

## 2018-01-09 DIAGNOSIS — Z170 Estrogen receptor positive status [ER+]: Secondary | ICD-10-CM | POA: Diagnosis not present

## 2018-01-09 DIAGNOSIS — Z800 Family history of malignant neoplasm of digestive organs: Secondary | ICD-10-CM

## 2018-01-09 LAB — CBC WITH DIFFERENTIAL/PLATELET
ABS IMMATURE GRANULOCYTES: 0.04 10*3/uL (ref 0.00–0.07)
Basophils Absolute: 0 10*3/uL (ref 0.0–0.1)
Basophils Relative: 0 %
Eosinophils Absolute: 0.2 10*3/uL (ref 0.0–0.5)
Eosinophils Relative: 3 %
HEMATOCRIT: 37.3 % (ref 36.0–46.0)
HEMOGLOBIN: 12.4 g/dL (ref 12.0–15.0)
Immature Granulocytes: 1 %
LYMPHS PCT: 30 %
Lymphs Abs: 1.9 10*3/uL (ref 0.7–4.0)
MCH: 30.6 pg (ref 26.0–34.0)
MCHC: 33.2 g/dL (ref 30.0–36.0)
MCV: 92.1 fL (ref 80.0–100.0)
MONO ABS: 0.4 10*3/uL (ref 0.1–1.0)
Monocytes Relative: 7 %
NEUTROS ABS: 3.8 10*3/uL (ref 1.7–7.7)
Neutrophils Relative %: 59 %
Platelets: 185 10*3/uL (ref 150–400)
RBC: 4.05 MIL/uL (ref 3.87–5.11)
RDW: 12.9 % (ref 11.5–15.5)
WBC: 6.4 10*3/uL (ref 4.0–10.5)
nRBC: 0 % (ref 0.0–0.2)

## 2018-01-09 LAB — COMPREHENSIVE METABOLIC PANEL
ALBUMIN: 3.9 g/dL (ref 3.5–5.0)
ALT: 24 U/L (ref 0–44)
AST: 23 U/L (ref 15–41)
Alkaline Phosphatase: 61 U/L (ref 38–126)
Anion gap: 11 (ref 5–15)
BUN: 12 mg/dL (ref 8–23)
CHLORIDE: 102 mmol/L (ref 98–111)
CO2: 24 mmol/L (ref 22–32)
Calcium: 9.5 mg/dL (ref 8.9–10.3)
Creatinine, Ser: 0.81 mg/dL (ref 0.44–1.00)
GFR calc Af Amer: >60 mL/min (ref >60)
GLUCOSE: 230 mg/dL — AB (ref 70–99)
POTASSIUM: 3.6 mmol/L (ref 3.5–5.1)
Sodium: 137 mmol/L (ref 135–145)
Total Bilirubin: 0.7 mg/dL (ref 0.3–1.2)
Total Protein: 6.8 g/dL (ref 6.5–8.1)

## 2018-01-09 MED ORDER — LETROZOLE 2.5 MG PO TABS: 2.5000 mg | ORAL_TABLET | Freq: Every day | ORAL | 3 refills | Status: DC

## 2018-01-09 NOTE — Progress Notes (Signed)
Hematology/Oncology Consult note [facility] [phone] [phone]   Patient Care Team: Idelle Crouch, MD as PCP - General (Internal Medicine)  REFERRING PROVIDER: Idelle Crouch, MD CHIEF COMPLAINTS/REASON FOR VISIT:  Follow up for breast cancer  HISTORY OF PRESENTING ILLNESS:  Gail Rivas is a  62 y.o.  female with PMH listed below who was referred to me for evaluation of breast cancer. Patient had screening mammogram and ultrasound on 10/15/2017 which showed left breast possible mass warrant further evaluation. Diagnostic breast mammogram on 7/31 2019 showed 6 mm spiculated mass slight distortion in the outer left breast.  Targeted ultrasound was performed showing no definite sonographic correlate is identified in the left breast.  Left axilla is negative for lymphadenopathy.   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  Grade 1, ER PR positive [>90%], HER-2 negative  Family history: Maternal grandmother was diagnosed with ovarian cancer in the 59s.  Father had colon cancer. History of radiation to chest: denies.   # # 11/21/2017 She is s/p lumpectomy and sentinel lymph node biopsy.  Pathology showed 48m invasive mammary carcinoma, grade 1, sentinel lymph node was negative.  stage 1A ER/PR +, HER2 negative. Her case was discussed at tumor board. Consensus was  No chemotherapy needed. adjuvant RT followed by anti estrogen treatment.   INTERVAL HISTORY Gail DELANDis a 62y.o. female who has above history reviewed by me today presents for follow-up visit for management of pT1a N0, stage 1A ER/PR +, HER2 negative.  Breast cancer She has started on adjuvant radiation.  Tolerates well.  No new complaints. Her last radiation treatment will be on November 14. She also had baseline bone density done and present to discuss  results.    Review of Systems  Constitutional: Negative for chills, fever, malaise/fatigue and weight loss.  HENT: Negative for nosebleeds and sore throat.   Eyes: Negative for double vision, photophobia and redness.  Respiratory: Negative for cough, shortness of breath and wheezing.   Cardiovascular: Negative for chest pain, palpitations and orthopnea.  Gastrointestinal: Negative for abdominal pain, blood in stool, nausea and vomiting.  Genitourinary: Negative for dysuria.  Musculoskeletal: Negative for back pain, myalgias and neck pain.  Skin: Negative for itching and rash.  Neurological: Negative for dizziness, tingling and tremors.  Endo/Heme/Allergies: Negative for environmental allergies. Does not bruise/bleed easily.  Psychiatric/Behavioral: Negative for depression. The patient is not nervous/anxious.     MEDICAL HISTORY:  Past Medical History:  Diagnosis Date  . Anxiety   . [place]   invasive mammary carcinoma  . Diabetes mellitus without complication ([place]   . Fibrocystic disease of breast   . GERD (gastroesophageal reflux disease)   . Headache   . Hyperlipidemia   . Hypertension   . PONV (postoperative nausea and vomiting)    Colonoscopy  . Sleep apnea     SURGICAL HISTORY: Past Surgical History:  Procedure Laterality Date  . BREAST BIOPSY Left 10/30/2017   invasive mammary carcinoma  . BREAST CYST ASPIRATION Left   . BREAST LUMPECTOMY Left 11/21/2017   invasive mammary carcinoma   . COLONOSCOPY    . COLONOSCOPY WITH PROPOFOL N/A 05/10/2017   Procedure: COLONOSCOPY WITH PROPOFOL;  Surgeon: EManya Silvas MD;  [facility];  Service: Endoscopy;  Laterality: N/A;  . PARTIAL MASTECTOMY WITH NEEDLE LOCALIZATION AND AXILLARY SENTINEL LYMPH NODE BX Left 11/21/2017   Procedure: PARTIAL  MASTECTOMY WITH NEEDLE LOCALIZATION AND AXILLARY SENTINEL LYMPH NODE BX;  Surgeon: Benjamine Sprague, DO;  Location: [facility] ORS;  Service: General;   Laterality: Left;  . wisdom teeth removal      SOCIAL HISTORY: Social History   Socioeconomic History  . Marital status: Married    Spouse name: Not on file  . Number of children: Not on file  . Years of education: Not on file  . Highest education level: Not on file  Occupational History  . Not on file  Social Needs  . Financial resource strain: Not on file  . Food insecurity:    Worry: Not on file    Inability: Not on file  . Transportation needs:    Medical: Not on file    Non-medical: Not on file  Tobacco Use  . Smoking status: Never Smoker  . Smokeless tobacco: Never Used  Substance and Sexual Activity  . Alcohol use: No    Frequency: Never  . Drug use: No  . Sexual activity: Yes  Lifestyle  . Physical activity:    Days per week: Not on file    Minutes per session: Not on file  . Stress: Not on file  Relationships  . Social connections:    Talks on phone: Not on file    Gets together: Not on file    Attends religious service: Not on file    Active member of club or organization: Not on file    Attends meetings of clubs or organizations: Not on file    Relationship status: Not on file  . Intimate partner violence:    Fear of current or ex partner: Not on file    Emotionally abused: Not on file    Physically abused: Not on file    Forced sexual activity: Not on file  Other Topics Concern  . Not on file  Social History Narrative  . Not on file    FAMILY HISTORY: Family History  Problem Relation Age of Onset  . Hypertension Mother   . Colon cancer Father   . Liver cancer Father   . Diabetes Father   . Ovarian cancer Maternal Grandmother   . Breast cancer Neg Hx     ALLERGIES:  is allergic to codeine; sulfa antibiotics; versed [midazolam]; and penicillin v.  MEDICATIONS:  Current Outpatient Medications  Medication Sig Dispense Refill  . cetirizine (ZYRTEC) 10 MG tablet Take 10 mg by mouth daily.    . clotrimazole-betamethasone (LOTRISONE) cream  Apply 1 application topically daily as needed (eczema).     Marland Kitchen glimepiride (AMARYL) 4 MG tablet Take 4 mg by mouth 2 (two) times daily.    . Lactobacillus Rhamnosus, GG, ([employer] PROBIOTIC, LACTOBACILLUS, PO) Take 1 capsule by mouth daily as needed (takes with acidic foods).     . metFORMIN (GLUCOPHAGE) 1000 MG tablet Take 1,000 mg by mouth 2 (two) times daily with a meal.    . metoprolol (TOPROL-XL) 200 MG 24 hr tablet Take 200 mg by mouth daily.    . sitaGLIPtin (JANUVIA) 100 MG tablet Take 100 mg by mouth daily.    Marland Kitchen telmisartan-hydrochlorothiazide (MICARDIS HCT) 80-25 MG tablet Take 1 tablet by mouth daily. Take 1 tablet by mouth daily     . pioglitazone (ACTOS) 45 MG tablet Take 45 mg by mouth daily.     No current facility-administered medications for this visit.      PHYSICAL EXAMINATION: ECOG PERFORMANCE STATUS: 0 - Asymptomatic Vitals:   01/09/18 1435  BP:  128/83  Pulse: 71  Resp: 18  Temp: (!) 96.1 F (35.6 C)   Filed Weights   01/09/18 1435  Weight: 164 lb 3.2 oz (74.5 kg)    Physical Exam  Constitutional: She is oriented to person, place, and time. She appears well-developed and well-nourished. No distress.  HENT:  Head: Normocephalic and atraumatic.  Right Ear: External ear normal.  Left Ear: External ear normal.  Mouth/Throat: Oropharynx is clear and moist.  Eyes: Pupils are equal, round, and reactive to light. EOM are normal. No scleral icterus.  Neck: Normal range of motion. Neck supple.  Cardiovascular: Normal rate, regular rhythm and normal heart sounds.  Pulmonary/Chest: Effort normal. No respiratory distress. She has no wheezes.  Abdominal: Soft. Bowel sounds are normal. She exhibits no distension and no mass. There is no tenderness.  Musculoskeletal: Normal range of motion. She exhibits no edema or deformity.  Neurological: She is alert and oriented to person, place, and time. No cranial nerve deficit. Coordination normal.  Skin: Skin is warm and dry. No rash  noted. No erythema.  Psychiatric: She has a normal mood and affect. Her behavior is normal. Thought content normal.       LABORATORY DATA:  I have reviewed the data as listed Lab Results  Component Value Date   WBC 6.4 01/09/2018   HGB 12.4 01/09/2018   HCT 37.3 01/09/2018   MCV 92.1 01/09/2018   PLT 185 01/09/2018   Recent Labs    11/06/17 1546 01/09/18 1415  NA 139 137  K 3.8 3.6  CL 103 102  CO2 23 24  GLUCOSE 138* 230*  BUN 17 12  CREATININE 0.80 0.81  CALCIUM 9.4 9.5  GFRNONAA >60 >60  GFRAA >60 >60  PROT 7.4 6.8  ALBUMIN 4.6 3.9  AST 28 23  ALT 30 24  ALKPHOS 59 61  BILITOT 1.4* 0.7   Iron/TIBC/Ferritin/ %Sat No results found for: IRON, TIBC, FERRITIN, IRONPCTSAT      ASSESSMENT & PLAN:  1. Malignant neoplasm of upper-outer quadrant of left breast in female, estrogen receptor positive ([place])   Cancer Staging Malignant neoplasm of upper-outer quadrant of left breast in female, estrogen receptor positive ([place]) Staging form: Breast, AJCC 8th Edition - Clinical: No stage assigned - Unsigned - Pathologic stage from 12/05/2017: Stage IA (pT1a, pN0, cM0, G1, ER+, PR+, HER2-) - Signed by Earlie Server, MD on 12/05/2017   # Breast Cancer, pT1a N0, stage 1A ER/PR +, HER2 negative.  Follow-up with radiation oncology and he finished adjuvant radiation course. I sent her prescription of letrozole and she can start after she finishes adjuvant radiation. Rationale and side effects of AI discussed with patient. Baseline bone density was independently reviewed by me and discussed with patient.  No osteopenia or osteoporosis. Recommend patient to take calcium and vitamin D supplements.  #Family history of ovarian cancer, personal history of breast cancer recommend genetic testing.  Refer to Dietitian.   We spent sufficient time to discuss many aspect of care, questions were answered to patient's satisfaction.  Orders Placed This Encounter  Procedures  . CBC with  Differential/Platelet    Standing Status:   Future    Standing Expiration Date:   01/10/2019  . Comprehensive metabolic panel    Standing Status:   Future    Standing Expiration Date:   01/10/2019    All questions were answered. The patient knows to call the clinic with any problems questions or concerns.  Return of visit: 03/06/2018  Earlie Server, MD, PhD Hematology Oncology [facility] Pager- [phone] 01/09/2018

## 2018-01-09 NOTE — Progress Notes (Signed)
Patient here for follow up. States she had a "bug" over the weekend, but it is resolved.

## 2018-01-10 ENCOUNTER — Ambulatory Visit
Admission: RE | Admit: 2018-01-10 | Discharge: 2018-01-10 | Disposition: A | Discharge status: Home / Self Care | Payer: BC Managed Care – PPO | Source: Ambulatory Visit | Attending: Radiation Oncology | Admitting: Radiation Oncology

## 2018-01-10 DIAGNOSIS — C50412 Malignant neoplasm of upper-outer quadrant of left female breast: Secondary | ICD-10-CM | POA: Diagnosis not present

## 2018-01-13 ENCOUNTER — Ambulatory Visit
Admission: RE | Admit: 2018-01-13 | Discharge: 2018-01-13 | Disposition: A | Discharge status: Home / Self Care | Payer: BC Managed Care – PPO | Source: Ambulatory Visit | Attending: Radiation Oncology | Admitting: Radiation Oncology

## 2018-01-13 DIAGNOSIS — C50412 Malignant neoplasm of upper-outer quadrant of left female breast: Secondary | ICD-10-CM | POA: Diagnosis not present

## 2018-01-14 ENCOUNTER — Ambulatory Visit
Admission: RE | Admit: 2018-01-14 | Discharge: 2018-01-14 | Disposition: A | Discharge status: Home / Self Care | Payer: BC Managed Care – PPO | Source: Ambulatory Visit | Attending: Radiation Oncology | Admitting: Radiation Oncology

## 2018-01-14 DIAGNOSIS — C50412 Malignant neoplasm of upper-outer quadrant of left female breast: Secondary | ICD-10-CM | POA: Diagnosis not present

## 2018-01-15 ENCOUNTER — Ambulatory Visit
Admission: RE | Admit: 2018-01-15 | Discharge: 2018-01-15 | Disposition: A | Discharge status: Home / Self Care | Payer: BC Managed Care – PPO | Source: Ambulatory Visit | Attending: Radiation Oncology | Admitting: Radiation Oncology

## 2018-01-15 DIAGNOSIS — C50412 Malignant neoplasm of upper-outer quadrant of left female breast: Secondary | ICD-10-CM | POA: Diagnosis not present

## 2018-01-16 ENCOUNTER — Ambulatory Visit
Admission: RE | Admit: 2018-01-16 | Discharge: 2018-01-16 | Disposition: A | Discharge status: Home / Self Care | Payer: BC Managed Care – PPO | Source: Ambulatory Visit | Attending: Radiation Oncology | Admitting: Radiation Oncology

## 2018-01-16 DIAGNOSIS — C50412 Malignant neoplasm of upper-outer quadrant of left female breast: Secondary | ICD-10-CM | POA: Diagnosis not present

## 2018-01-17 ENCOUNTER — Ambulatory Visit
Admission: RE | Admit: 2018-01-17 | Discharge: 2018-01-17 | Disposition: A | Discharge status: Home / Self Care | Payer: BC Managed Care – PPO | Source: Ambulatory Visit | Attending: Radiation Oncology | Admitting: Radiation Oncology

## 2018-01-17 DIAGNOSIS — C50412 Malignant neoplasm of upper-outer quadrant of left female breast: Secondary | ICD-10-CM | POA: Diagnosis not present

## 2018-01-20 ENCOUNTER — Other Ambulatory Visit: Payer: Self-pay | Admitting: *Deleted

## 2018-01-20 ENCOUNTER — Ambulatory Visit
Admission: RE | Admit: 2018-01-20 | Discharge: 2018-01-20 | Disposition: A | Discharge status: Home / Self Care | Payer: BC Managed Care – PPO | Source: Ambulatory Visit | Attending: Radiation Oncology | Admitting: Radiation Oncology

## 2018-01-20 DIAGNOSIS — C50412 Malignant neoplasm of upper-outer quadrant of left female breast: Secondary | ICD-10-CM | POA: Diagnosis not present

## 2018-01-20 DIAGNOSIS — Z170 Estrogen receptor positive status [ER+]: Principal | ICD-10-CM

## 2018-01-21 ENCOUNTER — Ambulatory Visit
Admission: RE | Admit: 2018-01-21 | Discharge: 2018-01-21 | Disposition: A | Discharge status: Home / Self Care | Payer: BC Managed Care – PPO | Source: Ambulatory Visit | Attending: Radiation Oncology | Admitting: Radiation Oncology

## 2018-01-21 DIAGNOSIS — C50412 Malignant neoplasm of upper-outer quadrant of left female breast: Secondary | ICD-10-CM | POA: Diagnosis not present

## 2018-01-22 ENCOUNTER — Ambulatory Visit
Admission: RE | Admit: 2018-01-22 | Discharge: 2018-01-22 | Disposition: A | Discharge status: Home / Self Care | Payer: BC Managed Care – PPO | Source: Ambulatory Visit | Attending: Radiation Oncology | Admitting: Radiation Oncology

## 2018-01-22 DIAGNOSIS — C50412 Malignant neoplasm of upper-outer quadrant of left female breast: Secondary | ICD-10-CM | POA: Diagnosis not present

## 2018-01-23 ENCOUNTER — Ambulatory Visit
Admission: RE | Admit: 2018-01-23 | Discharge: 2018-01-23 | Disposition: A | Discharge status: Home / Self Care | Payer: BC Managed Care – PPO | Source: Ambulatory Visit | Attending: Radiation Oncology | Admitting: Radiation Oncology

## 2018-01-23 DIAGNOSIS — C50412 Malignant neoplasm of upper-outer quadrant of left female breast: Secondary | ICD-10-CM | POA: Diagnosis not present

## 2018-01-24 ENCOUNTER — Ambulatory Visit
Admission: RE | Admit: 2018-01-24 | Discharge: 2018-01-24 | Disposition: A | Discharge status: Home / Self Care | Payer: BC Managed Care – PPO | Source: Ambulatory Visit | Attending: Radiation Oncology | Admitting: Radiation Oncology

## 2018-01-24 DIAGNOSIS — Z170 Estrogen receptor positive status [ER+]: Secondary | ICD-10-CM | POA: Diagnosis not present

## 2018-01-24 DIAGNOSIS — Z510 Encounter for antineoplastic radiation therapy: Secondary | ICD-10-CM | POA: Diagnosis not present

## 2018-01-24 DIAGNOSIS — C50412 Malignant neoplasm of upper-outer quadrant of left female breast: Secondary | ICD-10-CM | POA: Insufficient documentation

## 2018-01-27 ENCOUNTER — Ambulatory Visit
Admission: RE | Admit: 2018-01-27 | Discharge: 2018-01-27 | Disposition: A | Discharge status: Home / Self Care | Payer: BC Managed Care – PPO | Source: Ambulatory Visit | Attending: Radiation Oncology | Admitting: Radiation Oncology

## 2018-01-27 DIAGNOSIS — C50412 Malignant neoplasm of upper-outer quadrant of left female breast: Secondary | ICD-10-CM | POA: Diagnosis not present

## 2018-01-28 ENCOUNTER — Ambulatory Visit
Admission: RE | Admit: 2018-01-28 | Discharge: 2018-01-28 | Disposition: A | Discharge status: Home / Self Care | Payer: BC Managed Care – PPO | Source: Ambulatory Visit | Attending: Radiation Oncology | Admitting: Radiation Oncology

## 2018-01-28 DIAGNOSIS — C50412 Malignant neoplasm of upper-outer quadrant of left female breast: Secondary | ICD-10-CM | POA: Diagnosis not present

## 2018-01-29 ENCOUNTER — Ambulatory Visit
Admission: RE | Admit: 2018-01-29 | Discharge: 2018-01-29 | Disposition: A | Discharge status: Home / Self Care | Payer: BC Managed Care – PPO | Source: Ambulatory Visit | Attending: Radiation Oncology | Admitting: Radiation Oncology

## 2018-01-29 DIAGNOSIS — C50412 Malignant neoplasm of upper-outer quadrant of left female breast: Secondary | ICD-10-CM | POA: Diagnosis not present

## 2018-01-30 ENCOUNTER — Ambulatory Visit
Admission: RE | Admit: 2018-01-30 | Discharge: 2018-01-30 | Disposition: A | Discharge status: Home / Self Care | Payer: BC Managed Care – PPO | Source: Ambulatory Visit | Attending: Radiation Oncology | Admitting: Radiation Oncology

## 2018-01-30 DIAGNOSIS — C50412 Malignant neoplasm of upper-outer quadrant of left female breast: Secondary | ICD-10-CM | POA: Diagnosis not present

## 2018-01-31 ENCOUNTER — Ambulatory Visit
Admission: RE | Admit: 2018-01-31 | Discharge: 2018-01-31 | Disposition: A | Discharge status: Home / Self Care | Payer: BC Managed Care – PPO | Source: Ambulatory Visit | Attending: Radiation Oncology | Admitting: Radiation Oncology

## 2018-01-31 DIAGNOSIS — C50412 Malignant neoplasm of upper-outer quadrant of left female breast: Secondary | ICD-10-CM | POA: Diagnosis not present

## 2018-02-03 ENCOUNTER — Ambulatory Visit: Payer: BC Managed Care – PPO

## 2018-02-04 ENCOUNTER — Ambulatory Visit: Payer: BC Managed Care – PPO

## 2018-02-05 ENCOUNTER — Ambulatory Visit: Payer: BC Managed Care – PPO

## 2018-02-06 ENCOUNTER — Ambulatory Visit: Payer: BC Managed Care – PPO

## 2018-02-12 ENCOUNTER — Telehealth: Payer: Self-pay | Admitting: Genetics

## 2018-02-12 NOTE — Telephone Encounter (Signed)
Ottis Stain (Web designer for genetics) called patient to confirm genetic counseling appointment 02/13/18.   Patient requested her appointment be canceled.

## 2018-02-13 ENCOUNTER — Encounter: Payer: BC Managed Care – PPO | Admitting: Genetics

## 2018-03-06 ENCOUNTER — Other Ambulatory Visit: Payer: Self-pay

## 2018-03-06 ENCOUNTER — Inpatient Hospital Stay (HOSPITAL_BASED_OUTPATIENT_CLINIC_OR_DEPARTMENT_OTHER): Payer: BC Managed Care – PPO | Admitting: Oncology

## 2018-03-06 ENCOUNTER — Inpatient Hospital Stay: Payer: BC Managed Care – PPO | Attending: Oncology

## 2018-03-06 ENCOUNTER — Encounter: Payer: Self-pay | Admitting: Oncology

## 2018-03-06 VITALS — BP 113/77 | HR 69 | Temp 97.0°F | Resp 18 | Wt 167.0 lb

## 2018-03-06 DIAGNOSIS — Z170 Estrogen receptor positive status [ER+]: Secondary | ICD-10-CM | POA: Insufficient documentation

## 2018-03-06 DIAGNOSIS — I10 Essential (primary) hypertension: Secondary | ICD-10-CM | POA: Diagnosis not present

## 2018-03-06 DIAGNOSIS — C50412 Malignant neoplasm of upper-outer quadrant of left female breast: Secondary | ICD-10-CM | POA: Insufficient documentation

## 2018-03-06 DIAGNOSIS — Z7984 Long term (current) use of oral hypoglycemic drugs: Secondary | ICD-10-CM | POA: Insufficient documentation

## 2018-03-06 DIAGNOSIS — Z79811 Long term (current) use of aromatase inhibitors: Secondary | ICD-10-CM | POA: Insufficient documentation

## 2018-03-06 DIAGNOSIS — Z8041 Family history of malignant neoplasm of ovary: Secondary | ICD-10-CM | POA: Insufficient documentation

## 2018-03-06 DIAGNOSIS — Z800 Family history of malignant neoplasm of digestive organs: Secondary | ICD-10-CM | POA: Diagnosis not present

## 2018-03-06 DIAGNOSIS — Z79899 Other long term (current) drug therapy: Secondary | ICD-10-CM | POA: Diagnosis not present

## 2018-03-06 DIAGNOSIS — Z923 Personal history of irradiation: Secondary | ICD-10-CM | POA: Diagnosis not present

## 2018-03-06 DIAGNOSIS — E119 Type 2 diabetes mellitus without complications: Secondary | ICD-10-CM

## 2018-03-06 NOTE — Progress Notes (Signed)
Patient here for follow up. Pt states that she has had 3 places with skin cancer removed.

## 2018-03-06 NOTE — Progress Notes (Signed)
Hematology/Oncology Consult note [facility] [phone] [phone]   Patient Care Team: Idelle Crouch, MD as PCP - General (Internal Medicine)  REFERRING PROVIDER: Idelle Crouch, MD CHIEF COMPLAINTS/REASON FOR VISIT:  Follow up for breast cancer  HISTORY OF PRESENTING ILLNESS:  Gail Rivas is a  62 y.o.  female with PMH listed below who was referred to me for evaluation of breast cancer. Patient had screening mammogram and ultrasound on 10/15/2017 which showed left breast possible mass warrant further evaluation. Diagnostic breast mammogram on 7/31 2019 showed 6 mm spiculated mass slight distortion in the outer left breast.  Targeted ultrasound was performed showing no definite sonographic correlate is identified in the left breast.  Left axilla is negative for lymphadenopathy.   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  Grade 1, ER PR positive [>90%], HER-2 negative  Family history: Maternal grandmother was diagnosed with ovarian cancer in the 82s.  Father had colon cancer. History of radiation to chest: denies.   # # 11/21/2017 She is s/p lumpectomy and sentinel lymph node biopsy.  Pathology showed 107m invasive mammary carcinoma, grade 1, sentinel lymph node was negative.  stage 1A ER/PR +, HER2 negative.Her case was discussed at tumor board. Consensus was  No chemotherapy needed. adjuvant RT followed by anti estrogen treatment.  # # S/p Adjuvant RT finished 01/31/2018  # Started adjuvant antiestrogen with Letrozole on 11/21/ 2019.  INTERVAL HISTORY CCLIFTON KOVACICis a 62y.o. female who has above history reviewed by me today presents for follow-up visit for management of pT1a N0, stage 1A ER/PR +, HER2 negative Breast cancer  # Patient reports recently had a few skin cancer resections for basal cell cancer One of the incision on her  left upper extremity was infected and she was started on doxycycline.  She attempted to give blood sample today at Lab but feel technician could not get blood successfully.  She could not tolerate more pokes.   Tolerates Letrozole well, with manageable intermittent hot flash. Denies bone pain.   Review of Systems  Constitutional: Negative for chills, fever, malaise/fatigue and weight loss.  HENT: Negative for nosebleeds and sore throat.   Eyes: Negative for double vision, photophobia and redness.  Respiratory: Negative for cough, shortness of breath and wheezing.   Cardiovascular: Negative for chest pain, palpitations, orthopnea and leg swelling.  Gastrointestinal: Negative for abdominal pain, blood in stool, nausea and vomiting.  Genitourinary: Negative for dysuria.  Musculoskeletal: Negative for back pain, myalgias and neck pain.  Skin: Negative for itching and rash.  Neurological: Negative for dizziness, tingling and tremors.  Endo/Heme/Allergies: Negative for environmental allergies. Does not bruise/bleed easily.  Psychiatric/Behavioral: Negative for depression and hallucinations. The patient is not nervous/anxious.     MEDICAL HISTORY:  Past Medical History:  Diagnosis Date  . Anxiety   . Breast cancer ([place] 10/2017   invasive mammary carcinoma  . Diabetes mellitus without complication ([place]   . Fibrocystic disease of breast   . GERD (gastroesophageal reflux disease)   . Headache   . Hyperlipidemia   . Hypertension   . PONV (postoperative nausea and vomiting)    Colonoscopy  . Sleep apnea     SURGICAL HISTORY: Past Surgical History:  Procedure Laterality Date  . BREAST BIOPSY Left 10/30/2017   invasive mammary carcinoma  . BREAST CYST ASPIRATION Left   . BREAST LUMPECTOMY Left 11/21/2017   invasive mammary carcinoma   .  COLONOSCOPY    . COLONOSCOPY WITH PROPOFOL N/A 05/10/2017   Procedure: COLONOSCOPY WITH PROPOFOL;  Surgeon: Manya Silvas, MD;  Location: [facility]  ENDOSCOPY;  Service: Endoscopy;  Laterality: N/A;  . PARTIAL MASTECTOMY WITH NEEDLE LOCALIZATION AND AXILLARY SENTINEL LYMPH NODE BX Left 11/21/2017   Procedure: PARTIAL MASTECTOMY WITH NEEDLE LOCALIZATION AND AXILLARY SENTINEL LYMPH NODE BX;  Surgeon: Benjamine Sprague, DO;  Location: [facility] ORS;  Service: General;  Laterality: Left;  . wisdom teeth removal      SOCIAL HISTORY: Social History   Socioeconomic History  . Marital status: Married    Spouse name: Not on file  . Number of children: Not on file  . Years of education: Not on file  . Highest education level: Not on file  Occupational History  . Not on file  Social Needs  . Financial resource strain: Not on file  . Food insecurity:    Worry: Not on file    Inability: Not on file  . Transportation needs:    Medical: Not on file    Non-medical: Not on file  Tobacco Use  . Smoking status: Never Smoker  . Smokeless tobacco: Never Used  Substance and Sexual Activity  . Alcohol use: No    Frequency: Never  . Drug use: No  . Sexual activity: Yes  Lifestyle  . Physical activity:    Days per week: Not on file    Minutes per session: Not on file  . Stress: Not on file  Relationships  . Social connections:    Talks on phone: Not on file    Gets together: Not on file    Attends religious service: Not on file    Active member of club or organization: Not on file    Attends meetings of clubs or organizations: Not on file    Relationship status: Not on file  . Intimate partner violence:    Fear of current or ex partner: Not on file    Emotionally abused: Not on file    Physically abused: Not on file    Forced sexual activity: Not on file  Other Topics Concern  . Not on file  Social History Narrative  . Not on file    FAMILY HISTORY: Family History  Problem Relation Age of Onset  . Hypertension Mother   . Colon cancer Father   . Liver cancer Father   . Diabetes Father   . Ovarian cancer Maternal Grandmother   . Breast  cancer Neg Hx     ALLERGIES:  is allergic to codeine; sulfa antibiotics; versed [midazolam]; and penicillin v.  MEDICATIONS:  Current Outpatient Medications  Medication Sig Dispense Refill  . cetirizine (ZYRTEC) 10 MG tablet Take 10 mg by mouth daily.    . clotrimazole-betamethasone (LOTRISONE) cream Apply 1 application topically daily as needed (eczema).     Marland Kitchen glimepiride (AMARYL) 4 MG tablet Take 4 mg by mouth 2 (two) times daily.    . Lactobacillus Rhamnosus, GG, ([employer] PROBIOTIC, LACTOBACILLUS, PO) Take 1 capsule by mouth daily as needed (takes with acidic foods).     Marland Kitchen letrozole (FEMARA) 2.5 MG tablet Take 1 tablet (2.5 mg total) by mouth daily. 30 tablet 3  . metFORMIN (GLUCOPHAGE) 1000 MG tablet Take 1,000 mg by mouth 2 (two) times daily with a meal.    . metoprolol (TOPROL-XL) 200 MG 24 hr tablet Take 200 mg by mouth daily.    . pioglitazone (ACTOS) 45 MG tablet Take 45 mg by mouth  daily.    . sitaGLIPtin (JANUVIA) 100 MG tablet Take 100 mg by mouth daily.    Marland Kitchen telmisartan-hydrochlorothiazide (MICARDIS HCT) 80-25 MG tablet Take 1 tablet by mouth daily. Take 1 tablet by mouth daily      No current facility-administered medications for this visit.      PHYSICAL EXAMINATION: ECOG PERFORMANCE STATUS: 0 - Asymptomatic Vitals:   03/06/18 1448  BP: 113/77  Pulse: 69  Resp: 18  Temp: (!) 97 F (36.1 C)   Filed Weights   03/06/18 1448  Weight: 167 lb (75.8 kg)    Physical Exam Constitutional:      General: She is not in acute distress.    Appearance: She is well-developed.  HENT:     Head: Normocephalic and atraumatic.     Right Ear: External ear normal.     Left Ear: External ear normal.  Eyes:     General: No scleral icterus.    Pupils: Pupils are equal, round, and reactive to light.  Neck:     Musculoskeletal: Normal range of motion and neck supple.  Cardiovascular:     Rate and Rhythm: Normal rate and regular rhythm.     Heart sounds: Normal heart sounds.    Pulmonary:     Effort: Pulmonary effort is normal. No respiratory distress.     Breath sounds: No wheezing.  Abdominal:     General: Bowel sounds are normal. There is no distension.     Palpations: Abdomen is soft. There is no mass.     Tenderness: There is no abdominal tenderness.  Musculoskeletal: Normal range of motion.        General: No deformity.  Skin:    General: Skin is warm and dry.     Findings: No erythema or rash.  Neurological:     Mental Status: She is alert and oriented to person, place, and time.     Cranial Nerves: No cranial nerve deficit.     Coordination: Coordination normal.  Psychiatric:        Mood and Affect: Mood normal.        Behavior: Behavior normal.   Breast exam was performed in seated and lying down position. Patient is status post left lumpectomy with a well-healed surgical scar. No evidence of any palpable masses. No evidence of bilateral axillary adenopathy. No palpable masses or lumps in the right breast.       LABORATORY DATA:  I have reviewed the data as listed Lab Results  Component Value Date   WBC 6.4 01/09/2018   HGB 12.4 01/09/2018   HCT 37.3 01/09/2018   MCV 92.1 01/09/2018   PLT 185 01/09/2018   Recent Labs    11/06/17 1546 01/09/18 1415  NA 139 137  K 3.8 3.6  CL 103 102  CO2 23 24  GLUCOSE 138* 230*  BUN 17 12  CREATININE 0.80 0.81  CALCIUM 9.4 9.5  GFRNONAA >60 >60  GFRAA >60 >60  PROT 7.4 6.8  ALBUMIN 4.6 3.9  AST 28 23  ALT 30 24  ALKPHOS 59 61  BILITOT 1.4* 0.7   Iron/TIBC/Ferritin/ %Sat No results found for: IRON, TIBC, FERRITIN, IRONPCTSAT      ASSESSMENT & PLAN:  1. Malignant neoplasm of upper-outer quadrant of left breast in female, estrogen receptor positive ([place])   2. Aromatase inhibitor use   3. Family history of ovarian cancer   Cancer Staging Malignant neoplasm of upper-outer quadrant of left breast in female, estrogen receptor positive ([place])  Staging form: Breast, AJCC 8th Edition -  Clinical: No stage assigned - Unsigned - Pathologic stage from 12/05/2017: Stage IA (pT1a, pN0, cM0, G1, ER+, PR+, HER2-) - Signed by Earlie Server, MD on 12/05/2017   # Breast Cancer, pT1a N0, stage 1A ER/PR +, HER2 negative.  Tolerates adjuvant Letrozole well. Plan continue for 10 years if tolerating.  Baseline bone density was normal.  Recommend annual mammogram which is due June 2018.  Recommend patient to continue take over the counter calcium and vitamin D supplements.   She could not give blood work today. Ask patient to get lab work up done with primary care physician.  #Family history of ovarian cancer, personal history of breast cancer recommend genetic testing. I have refer to genetic counselor who called patient and could not reach her.   We spent sufficient time to discuss many aspect of care, questions were answered to patient's satisfaction.  Orders Placed This Encounter  Procedures  . CBC with Differential/Platelet    Standing Status:   Future    Standing Expiration Date:   03/07/2019  . Comprehensive metabolic panel    Standing Status:   Future    Standing Expiration Date:   03/07/2019    All questions were answered. The patient knows to call the clinic with any problems questions or concerns.  Return of visit:  6 months.  Total face to face encounter time for this patient visit was 15 min. >50% of the time was  spent in counseling and coordination of care.  Earlie Server, MD, PhD Hematology Oncology [facility] at [facility] Pager- [phone] 03/06/2018

## 2018-03-12 ENCOUNTER — Encounter: Payer: Self-pay | Admitting: Radiation Oncology

## 2018-03-12 ENCOUNTER — Other Ambulatory Visit: Payer: Self-pay

## 2018-03-12 ENCOUNTER — Ambulatory Visit
Admission: RE | Admit: 2018-03-12 | Discharge: 2018-03-12 | Disposition: A | Discharge status: Home / Self Care | Payer: BC Managed Care – PPO | Source: Ambulatory Visit | Attending: Radiation Oncology | Admitting: Radiation Oncology

## 2018-03-12 VITALS — BP 131/75 | HR 70 | Temp 96.9°F | Resp 18 | Wt 167.4 lb

## 2018-03-12 DIAGNOSIS — C50412 Malignant neoplasm of upper-outer quadrant of left female breast: Secondary | ICD-10-CM | POA: Insufficient documentation

## 2018-03-12 DIAGNOSIS — Z170 Estrogen receptor positive status [ER+]: Secondary | ICD-10-CM | POA: Insufficient documentation

## 2018-03-12 DIAGNOSIS — Z923 Personal history of irradiation: Secondary | ICD-10-CM | POA: Diagnosis not present

## 2018-03-12 NOTE — Progress Notes (Signed)
Radiation Oncology Follow up Note  Name: Gail Rivas   Date:   03/12/2018 MRN:  [id number] DOB: 1955-09-02    This 62 y.o. female presents to the clinic today for one-month follow-up status post whole breast radiation to her left breast for stage I ER/PR positive invasive mammary carcinoma.  REFERRING PROVIDER: Idelle Crouch, MD  HPI: patient is a 62 year old female now out 1 month having completed whole breast radiation to her left breast for ER/PR positive invasive mammary carcinoma. Seen today in routine follow-up she is doing well. She specifically denies breast tenderness cough or bone pain. She's been started on letrozole tolerating that well without side effect..  COMPLICATIONS OF TREATMENT: none  FOLLOW UP COMPLIANCE: keeps appointments   PHYSICAL EXAM:  BP 131/75 (BP Location: Left Arm, Patient Position: Sitting)   Pulse 70   Temp (!) 96.9 F (36.1 C) (Tympanic)   Resp 18   Wt 167 lb 7 oz (76 kg)   BMI 27.86 kg/m  Lungs are clear to A&P cardiac examination essentially unremarkable with regular rate and rhythm. No dominant mass or nodularity is noted in either breast in 2 positions examined. Incision is well-healed. No axillary or supraclavicular adenopathy is appreciated. Cosmetic result is excellent.Well-developed well-nourished patient in NAD. HEENT reveals PERLA, EOMI, discs not visualized.  Oral cavity is clear. No oral mucosal lesions are identified. Neck is clear without evidence of cervical or supraclavicular adenopathy. Lungs are clear to A&P. Cardiac examination is essentially unremarkable with regular rate and rhythm without murmur rub or thrill. Abdomen is benign with no organomegaly or masses noted. Motor sensory and DTR levels are equal and symmetric in the upper and lower extremities. Cranial nerves II through XII are grossly intact. Proprioception is intact. No peripheral adenopathy or edema is identified. No motor or sensory levels are noted. Crude  visual fields are within normal range.  RADIOLOGY RESULTS: no current films for review  PLAN: at the present time patient is doing well 1 month out from whole breast radiation I'm please were overall progress. She continues on letrozole without side effect. I've asked to see her back in 4-5 months for follow-up. She knows to call sooner with any concerns.  I would like to take this opportunity to thank you for allowing me to participate in the care of your patient.Noreene Filbert, MD

## 2018-04-04 ENCOUNTER — Other Ambulatory Visit: Payer: Self-pay | Admitting: Oncology

## 2018-04-04 ENCOUNTER — Telehealth: Payer: Self-pay | Admitting: *Deleted

## 2018-04-04 MED ORDER — ANASTROZOLE 1 MG PO TABS: 1.0000 mg | ORAL_TABLET | Freq: Every day | ORAL | 3 refills | Status: DC

## 2018-04-04 NOTE — Telephone Encounter (Signed)
Called and spoke with husband because patient is at work and I told him that we switched her to anastrozole, he states he will let her know

## 2018-04-04 NOTE — Telephone Encounter (Signed)
Patient unable to get Letrozole as it is on national backorder, She is asking what she is to do. Please advise

## 2018-07-07 ENCOUNTER — Inpatient Hospital Stay: Payer: BC Managed Care – PPO | Attending: Oncology

## 2018-07-07 ENCOUNTER — Other Ambulatory Visit: Payer: Self-pay

## 2018-07-07 ENCOUNTER — Encounter: Payer: Self-pay | Admitting: Oncology

## 2018-07-07 ENCOUNTER — Inpatient Hospital Stay: Payer: BC Managed Care – PPO | Admitting: Oncology

## 2018-07-07 DIAGNOSIS — C50412 Malignant neoplasm of upper-outer quadrant of left female breast: Secondary | ICD-10-CM | POA: Diagnosis not present

## 2018-07-07 DIAGNOSIS — Z170 Estrogen receptor positive status [ER+]: Secondary | ICD-10-CM | POA: Insufficient documentation

## 2018-07-07 DIAGNOSIS — Z79811 Long term (current) use of aromatase inhibitors: Secondary | ICD-10-CM | POA: Insufficient documentation

## 2018-07-07 DIAGNOSIS — Z923 Personal history of irradiation: Secondary | ICD-10-CM | POA: Insufficient documentation

## 2018-07-07 LAB — CBC WITH DIFFERENTIAL/PLATELET
Abs Immature Granulocytes: 0.03 10*3/uL (ref 0.00–0.07)
Basophils Absolute: 0 10*3/uL (ref 0.0–0.1)
Basophils Relative: 0 %
Eosinophils Absolute: 0.2 10*3/uL (ref 0.0–0.5)
Eosinophils Relative: 2 %
HCT: 41.1 % (ref 36.0–46.0)
Hemoglobin: 13.6 g/dL (ref 12.0–15.0)
Immature Granulocytes: 0 %
Lymphocytes Relative: 27 %
Lymphs Abs: 2.2 10*3/uL (ref 0.7–4.0)
MCH: 30.4 pg (ref 26.0–34.0)
MCHC: 33.1 g/dL (ref 30.0–36.0)
MCV: 91.9 fL (ref 80.0–100.0)
Monocytes Absolute: 0.6 10*3/uL (ref 0.1–1.0)
Monocytes Relative: 7 %
Neutro Abs: 5.1 10*3/uL (ref 1.7–7.7)
Neutrophils Relative %: 64 %
Platelets: 195 10*3/uL (ref 150–400)
RBC: 4.47 MIL/uL (ref 3.87–5.11)
RDW: 13.1 % (ref 11.5–15.5)
WBC: 8.1 10*3/uL (ref 4.0–10.5)
nRBC: 0 % (ref 0.0–0.2)

## 2018-07-07 LAB — COMPREHENSIVE METABOLIC PANEL
ALT: 31 U/L (ref 0–44)
AST: 23 U/L (ref 15–41)
Albumin: 4.2 g/dL (ref 3.5–5.0)
Alkaline Phosphatase: 64 U/L (ref 38–126)
Anion gap: 12 (ref 5–15)
BUN: 18 mg/dL (ref 8–23)
CO2: 23 mmol/L (ref 22–32)
Calcium: 9.2 mg/dL (ref 8.9–10.3)
Chloride: 102 mmol/L (ref 98–111)
Creatinine, Ser: 0.79 mg/dL (ref 0.44–1.00)
GFR calc Af Amer: >60 mL/min (ref >60)
GFR calc non Af Amer: >60 mL/min (ref >60)
Glucose, Bld: 159 mg/dL — ABNORMAL HIGH (ref 70–99)
Potassium: 4.4 mmol/L (ref 3.5–5.1)
Sodium: 137 mmol/L (ref 135–145)
Total Bilirubin: 1.1 mg/dL (ref 0.3–1.2)
Total Protein: 7.1 g/dL (ref 6.5–8.1)

## 2018-07-07 NOTE — Progress Notes (Signed)
Called patient for TeleVisit.  Patient c/o some tenderness at incision site when she reaches for things, "feels like someone is stretching my muscles, not painful, but sore".

## 2018-07-14 ENCOUNTER — Inpatient Hospital Stay (HOSPITAL_BASED_OUTPATIENT_CLINIC_OR_DEPARTMENT_OTHER): Payer: BC Managed Care – PPO | Admitting: Oncology

## 2018-07-14 ENCOUNTER — Other Ambulatory Visit: Payer: Self-pay

## 2018-07-14 ENCOUNTER — Encounter: Payer: Self-pay | Admitting: Oncology

## 2018-07-14 VITALS — BP 129/79 | HR 73 | Temp 97.6°F | Resp 18 | Wt 166.4 lb

## 2018-07-14 DIAGNOSIS — Z8041 Family history of malignant neoplasm of ovary: Secondary | ICD-10-CM

## 2018-07-14 DIAGNOSIS — C50412 Malignant neoplasm of upper-outer quadrant of left female breast: Secondary | ICD-10-CM

## 2018-07-14 DIAGNOSIS — Z170 Estrogen receptor positive status [ER+]: Secondary | ICD-10-CM | POA: Diagnosis not present

## 2018-07-14 DIAGNOSIS — Z79811 Long term (current) use of aromatase inhibitors: Secondary | ICD-10-CM

## 2018-07-14 DIAGNOSIS — Z923 Personal history of irradiation: Secondary | ICD-10-CM

## 2018-07-14 MED ORDER — ANASTROZOLE 1 MG PO TABS: 1.0000 mg | ORAL_TABLET | Freq: Every day | ORAL | 2 refills | Status: DC

## 2018-07-14 NOTE — Progress Notes (Signed)
Patient here for follow up. Pt complains of discomfort under left arm when she raises arm. Patient requesting refill anastrozole.

## 2018-07-14 NOTE — Progress Notes (Signed)
Hematology/Oncology Consult note [facility] Telephone:([phone] [phone]   Patient Care Team: Idelle Crouch, MD as PCP - General (Internal Medicine)  REFERRING PROVIDER: Idelle Crouch, MD CHIEF COMPLAINTS/REASON FOR VISIT:  3 months follow-up for management of breast cancer  HISTORY OF PRESENTING ILLNESS:  Gail Rivas is a  63 y.o.  female with PMH listed below who was referred to me for evaluation of breast cancer. Patient had screening mammogram and ultrasound on 10/15/2017 which showed left breast possible mass warrant further evaluation. Diagnostic breast mammogram on 7/31 2019 showed 6 mm spiculated mass slight distortion in the outer left breast.  Targeted ultrasound was performed showing no definite sonographic correlate is identified in the left breast.  Left axilla is negative for lymphadenopathy.   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  Grade 1, ER PR positive [>90%], HER-2 negative  Family history: Maternal grandmother was diagnosed with ovarian cancer in the 10s.  Father had colon cancer. History of radiation to chest: denies.   # # 11/21/2017 She is s/p lumpectomy and sentinel lymph node biopsy.  Pathology showed 23m invasive mammary carcinoma, grade 1, sentinel lymph node was negative.  stage 1A ER/PR +, HER2 negative.Her case was discussed at tumor board. Consensus was  No chemotherapy needed. adjuvant RT followed by anti estrogen treatment.  # # S/p Adjuvant RT finished 01/31/2018  # Started adjuvant antiestrogen with Letrozole on 11/21/ 2019. 04/04/2018 switch to anastrozole as letrozole is on national back order. INTERVAL HISTORY Gail RUFFis a 63y.o. female who has above history reviewed by me today presents for follow-up visit for management of pT1a N0, stage 1A ER/PR +, HER2 negative Breast cancer  Patient has  been switched to anastrozole since January 2020.  Reports tolerating well.  Denies hot flash, body aches, mood swing.  Denies any new bone pain.  She has some concern about her left breast. She feels "pulling sensation around her left breast/left axillary when she raised her left arm".  Denies any lumps or bumps that she feels.  Started about 4 to 5 weeks ago.  No exacerbation or alleviating factors.  Otherwise she has no new concerns complaints today.  Review of Systems  Constitutional: Negative for chills, fever, malaise/fatigue and weight loss.  HENT: Negative for nosebleeds and sore throat.   Eyes: Negative for double vision, photophobia and redness.  Respiratory: Negative for cough, shortness of breath and wheezing.   Cardiovascular: Negative for chest pain, palpitations, orthopnea and leg swelling.  Gastrointestinal: Negative for abdominal pain, blood in stool, nausea and vomiting.  Genitourinary: Negative for dysuria.  Musculoskeletal: Negative for back pain, myalgias and neck pain.  Skin: Negative for itching and rash.  Neurological: Negative for dizziness, tingling and tremors.  Endo/Heme/Allergies: Negative for environmental allergies. Does not bruise/bleed easily.  Psychiatric/Behavioral: Negative for depression and hallucinations. The patient is not nervous/anxious.     MEDICAL HISTORY:  Past Medical History:  Diagnosis Date  . Anxiety   . [place]   invasive [place] carcinoma  . [place]   . Fibrocystic disease of breast   . GERD (gastroesophageal reflux disease)   . Headache   . Hyperlipidemia   . Hypertension   . PONV (postoperative nausea and vomiting)    Colonoscopy  . Sleep apnea     SURGICAL HISTORY: Past Surgical History:  Procedure Laterality Date  . BREAST  BIOPSY Left 10/30/2017   invasive mammary carcinoma  . BREAST CYST ASPIRATION Left   . BREAST LUMPECTOMY Left 11/21/2017   invasive mammary  carcinoma   . COLONOSCOPY    . COLONOSCOPY WITH PROPOFOL N/A 05/10/2017   Procedure: COLONOSCOPY WITH PROPOFOL;  Surgeon: Manya Silvas, MD;  [facility]COPY;  Service: Endoscopy;  Laterality: N/A;  . PARTIAL MASTECTOMY WITH NEEDLE LOCALIZATION AND AXILLARY SENTINEL LYMPH NODE BX Left 11/21/2017   Procedure: PARTIAL MASTECTOMY WITH NEEDLE LOCALIZATION AND AXILLARY SENTINEL LYMPH NODE BX;  Surgeon: Benjamine Sprague, DO;  Location: [facility] ORS;  Service: General;  Laterality: Left;  . wisdom teeth removal      SOCIAL HISTORY: Social History   Socioeconomic History  . Marital status: Married    Spouse name: Not on file  . Number of children: Not on file  . Years of education: Not on file  . Highest education level: Not on file  Occupational History  . Not on file  Social Needs  . Financial resource strain: Not on file  . Food insecurity:    Worry: Not on file    Inability: Not on file  . Transportation needs:    Medical: Not on file    Non-medical: Not on file  Tobacco Use  . Smoking status: Never Smoker  . Smokeless tobacco: Never Used  Substance and Sexual Activity  . Alcohol use: No    Frequency: Never  . Drug use: No  . Sexual activity: Yes  Lifestyle  . Physical activity:    Days per week: Not on file    Minutes per session: Not on file  . Stress: Not on file  Relationships  . Social connections:    Talks on phone: Not on file    Gets together: Not on file    Attends religious service: Not on file    Active member of club or organization: Not on file    Attends meetings of clubs or organizations: Not on file    Relationship status: Not on file  . Intimate partner violence:    Fear of current or ex partner: Not on file    Emotionally abused: Not on file    Physically abused: Not on file    Forced sexual activity: Not on file  Other Topics Concern  . Not on file  Social History Narrative  . Not on file    FAMILY HISTORY: Family History  Problem  Relation Age of Onset  . Hypertension Mother   . Colon cancer Father   . Liver cancer Father   . Diabetes Father   . Ovarian cancer Maternal Grandmother   . Breast cancer Neg Hx     ALLERGIES:  is allergic to codeine; sulfa antibiotics; versed [midazolam]; and penicillin v.  MEDICATIONS:  Current Outpatient Medications  Medication Sig Dispense Refill  . anastrozole (ARIMIDEX) 1 MG tablet Take 1 tablet (1 mg total) by mouth daily. 90 tablet 2  . calcium elemental as carbonate (BARIATRIC TUMS ULTRA) 400 MG chewable tablet Chew by mouth.    . cetirizine (ZYRTEC) 10 MG tablet Take 10 mg by mouth daily.    . Cholecalciferol (VITAMIN D-1000 MAX ST) 25 MCG (1000 UT) tablet Take 1,000 Units by mouth daily.    . clotrimazole-betamethasone (LOTRISONE) cream Apply 1 application topically daily as needed (eczema).     Marland Kitchen glimepiride (AMARYL) 4 MG tablet Take 4 mg by mouth 2 (two) times daily.    . Lactobacillus Rhamnosus, GG, ([employer] PROBIOTIC,  LACTOBACILLUS, PO) Take 1 capsule by mouth daily as needed (takes with acidic foods).     . metFORMIN (GLUCOPHAGE) 1000 MG tablet Take 1,000 mg by mouth 2 (two) times daily with a meal.    . metoprolol (TOPROL-XL) 200 MG 24 hr tablet Take 200 mg by mouth daily.    . pioglitazone (ACTOS) 45 MG tablet Take 45 mg by mouth daily.    . sitaGLIPtin (JANUVIA) 100 MG tablet Take 100 mg by mouth daily.    Marland Kitchen telmisartan-hydrochlorothiazide (MICARDIS HCT) 80-25 MG tablet Take 1 tablet by mouth daily. Take 1 tablet by mouth daily      No current facility-administered medications for this visit.      PHYSICAL EXAMINATION: ECOG PERFORMANCE STATUS: 0 - Asymptomatic Vitals:   07/14/18 0839  BP: 129/79  Pulse: 73  Resp: 18  Temp: 97.6 F (36.4 C)   Filed Weights   07/14/18 0839  Weight: 166 lb 6.4 oz (75.5 kg)    Physical Exam Constitutional:      General: She is not in acute distress.    Appearance: She is well-developed.  HENT:     Head: Normocephalic and  atraumatic.     Right Ear: External ear normal.     Left Ear: External ear normal.  Eyes:     General: No scleral icterus.    Pupils: Pupils are equal, round, and reactive to light.  Neck:     Musculoskeletal: Normal range of motion and neck supple.  Cardiovascular:     Rate and Rhythm: Normal rate and regular rhythm.     Heart sounds: Normal heart sounds.  Pulmonary:     Effort: Pulmonary effort is normal. No respiratory distress.     Breath sounds: No wheezing.  Abdominal:     General: Bowel sounds are normal. There is no distension.     Palpations: Abdomen is soft. There is no mass.     Tenderness: There is no abdominal tenderness.  Musculoskeletal: Normal range of motion.        General: No deformity.  Skin:    General: Skin is warm and dry.     Findings: No erythema or rash.  Neurological:     Mental Status: She is alert and oriented to person, place, and time.     Cranial Nerves: No cranial nerve deficit.     Coordination: Coordination normal.  Psychiatric:        Mood and Affect: Mood normal.        Behavior: Behavior normal.        Thought Content: Thought content normal.   Breast exam was performed in seated and lying down position. Patient is status post left lumpectomy with a well-healed surgical scar.  No evidence of any palpable masses bilaterally. No evidence of bilateral axillary adenopathy.`      LABORATORY DATA:  I have reviewed the data as listed Lab Results  Component Value Date   WBC 8.1 07/07/2018   HGB 13.6 07/07/2018   HCT 41.1 07/07/2018   MCV 91.9 07/07/2018   PLT 195 07/07/2018   Recent Labs    11/06/17 1546 01/09/18 1415 07/07/18 1327  NA 139 137 137  K 3.8 3.6 4.4  CL 103 102 102  CO2 '23 24 23  '$ GLUCOSE 138* 230* 159*  BUN '17 12 18  '$ CREATININE 0.80 0.81 0.79  CALCIUM 9.4 9.5 9.2  GFRNONAA >60 >60 >60  GFRAA >60 >60 >60  PROT 7.4 6.8 7.1  ALBUMIN 4.6 3.9  4.2  AST '28 23 23  '$ ALT '30 24 31  '$ ALKPHOS 59 61 64  BILITOT 1.4* 0.7  1.1   Iron/TIBC/Ferritin/ %Sat No results found for: IRON, TIBC, FERRITIN, IRONPCTSAT      ASSESSMENT & PLAN:  1. Malignant neoplasm of upper-outer quadrant of left breast in female, estrogen receptor positive ([place])   2. Aromatase inhibitor use   3. Family history of ovarian cancer   Cancer Staging Malignant neoplasm of upper-outer quadrant of left breast in female, estrogen receptor positive ([place]) Staging form: Breast, AJCC 8th Edition - Clinical: No stage assigned - Unsigned - Pathologic stage from 12/05/2017: Stage IA (pT1a, pN0, cM0, G1, ER+, PR+, HER2-) - Signed by Earlie Server, MD on 12/05/2017   # Breast Cancer, pT1a N0, stage 1A ER/PR +, HER2 negative.  Tolerating adjuvant anastrozole well.  Plan to continue 10 years if tolerating. Refills sent/  Labs are reviewed and discussed with patient. Discussed with patient that the pulling sensation when she raises her left arm may be secondary to radiation related fibrosis/scarring.  Reassurance was provided Baseline bone density was normal. Advised patient to take calcium 1000 mg with vitamin D 800 units daily. Recommend annual mammogram which is due in July 2020.  Family history of ovarian cancer, previously referred to genetic counselor.  And patient requests that appointment to be canceled. We spent sufficient time to discuss many aspect of care, questions were answered to patient's satisfaction.  Orders Placed This Encounter  Procedures  . MM DIAG BREAST TOMO BILATERAL    Standing Status:   Future    Standing Expiration Date:   07/14/2019    Order Specific Question:   Reason for Exam (SYMPTOM  OR DIAGNOSIS REQUIRED)    Answer:   history of breast cancer    Order Specific Question:   Preferred imaging location?    Answer:   [facility]  . US Breast Limited Uni Left Inc Axilla    Standing Status:   Future    Standing Expiration Date:   09/13/2019    Order Specific Question:   Reason for Exam (SYMPTOM  OR DIAGNOSIS REQUIRED)     Answer:   history of breast cancer    Order Specific Question:   Preferred imaging location?    Answer:   [facility]  . US Breast Limited Uni Right Inc Axilla    Standing Status:   Future    Standing Expiration Date:   09/13/2019    Order Specific Question:   Reason for Exam (SYMPTOM  OR DIAGNOSIS REQUIRED)    Answer:   history of breast cancer    Order Specific Question:   Preferred imaging location?    Answer:   [facility]    All questions were answered. The patient knows to call the clinic with any problems questions or concerns.  Return of visit: 3 months   Earlie Server, MD, PhD Hematology Oncology [facility] Pager- [phone] 07/14/2018

## 2018-09-10 ENCOUNTER — Other Ambulatory Visit: Payer: Self-pay

## 2018-09-10 ENCOUNTER — Ambulatory Visit
Admission: RE | Admit: 2018-09-10 | Discharge: 2018-09-10 | Disposition: A | Discharge status: Home / Self Care | Payer: BC Managed Care – PPO | Source: Ambulatory Visit | Attending: Radiation Oncology | Admitting: Radiation Oncology

## 2018-09-10 ENCOUNTER — Encounter: Payer: Self-pay | Admitting: Radiation Oncology

## 2018-09-10 VITALS — BP 126/76 | HR 65 | Temp 96.4°F | Resp 18 | Wt 165.3 lb

## 2018-09-10 DIAGNOSIS — Z79811 Long term (current) use of aromatase inhibitors: Secondary | ICD-10-CM | POA: Insufficient documentation

## 2018-09-10 DIAGNOSIS — C50412 Malignant neoplasm of upper-outer quadrant of left female breast: Secondary | ICD-10-CM | POA: Diagnosis present

## 2018-09-10 DIAGNOSIS — Z923 Personal history of irradiation: Secondary | ICD-10-CM | POA: Diagnosis not present

## 2018-09-10 DIAGNOSIS — Z170 Estrogen receptor positive status [ER+]: Secondary | ICD-10-CM | POA: Diagnosis not present

## 2018-09-10 NOTE — Progress Notes (Signed)
Radiation Oncology Follow up Note  Name: Gail Rivas   Date:   09/10/2018 MRN:  [id number] DOB: 1955-07-01    This 63 y.o. female presents to the clinic today for 36-month follow-up status post whole breast radiation to her left breast for stage I ER PR positive invasive mammary carcinoma.  REFERRING PROVIDER: Idelle Crouch, MD  HPI: Patient is a 63 year old female now at 6 months having completed whole breast radiation to her left breast for stage I ER PR positive invasive mammary carcinoma.  Seen today in routine follow-up she is doing well.  She specifically denies breast tenderness cough or bone pain..  She is currently on arimadex tolerating that well without side effect.  She is scheduled for mammogram in July which I will review when it becomes available.  COMPLICATIONS OF TREATMENT: none  FOLLOW UP COMPLIANCE: keeps appointments   PHYSICAL EXAM:  BP 126/76   Pulse 65   Temp (!) 96.4 F (35.8 C)   Resp 18   Wt 165 lb 5.5 oz (75 kg)   BMI 27.51 kg/m  Lungs are clear to A&P cardiac examination essentially unremarkable with regular rate and rhythm. No dominant mass or nodularity is noted in either breast in 2 positions examined. Incision is well-healed. No axillary or supraclavicular adenopathy is appreciated. Cosmetic result is excellent.  Well-developed well-nourished patient in NAD. HEENT reveals PERLA, EOMI, discs not visualized.  Oral cavity is clear. No oral mucosal lesions are identified. Neck is clear without evidence of cervical or supraclavicular adenopathy. Lungs are clear to A&P. Cardiac examination is essentially unremarkable with regular rate and rhythm without murmur rub or thrill. Abdomen is benign with no organomegaly or masses noted. Motor sensory and DTR levels are equal and symmetric in the upper and lower extremities. Cranial nerves II through XII are grossly intact. Proprioception is intact. No peripheral adenopathy or edema is identified. No motor or  sensory levels are noted. Crude visual fields are within normal range.  RADIOLOGY RESULTS: Mammogram in July will be reviewed when available  PLAN: Present time she continues to do well 6 months out from whole breast radiation with no evidence of disease.  Continues on arimadex therapy without side effect.  She is scheduled for mammogram next month with diet which I will review when it becomes available.  Patient knows to call with any concerns.  I have asked to see her back in 6 months for follow-up and then will go to once a year follow-up visits.  I would like to take this opportunity to thank you for allowing me to participate in the care of your patient.Noreene Filbert, MD

## 2018-10-17 ENCOUNTER — Ambulatory Visit
Admission: RE | Admit: 2018-10-17 | Discharge: 2018-10-17 | Disposition: A | Discharge status: Home / Self Care | Payer: BC Managed Care – PPO | Source: Ambulatory Visit | Attending: Oncology | Admitting: Oncology

## 2018-10-17 DIAGNOSIS — C50412 Malignant neoplasm of upper-outer quadrant of left female breast: Secondary | ICD-10-CM

## 2018-10-17 DIAGNOSIS — Z170 Estrogen receptor positive status [ER+]: Secondary | ICD-10-CM | POA: Diagnosis present

## 2018-10-20 ENCOUNTER — Other Ambulatory Visit: Payer: Self-pay

## 2018-10-20 ENCOUNTER — Inpatient Hospital Stay (HOSPITAL_BASED_OUTPATIENT_CLINIC_OR_DEPARTMENT_OTHER): Payer: BC Managed Care – PPO | Admitting: Oncology

## 2018-10-20 ENCOUNTER — Encounter: Payer: Self-pay | Admitting: Oncology

## 2018-10-20 ENCOUNTER — Inpatient Hospital Stay: Payer: BC Managed Care – PPO | Attending: Oncology

## 2018-10-20 VITALS — BP 132/79 | HR 61 | Temp 97.0°F | Wt 163.0 lb

## 2018-10-20 DIAGNOSIS — I10 Essential (primary) hypertension: Secondary | ICD-10-CM

## 2018-10-20 DIAGNOSIS — G4730 Sleep apnea, unspecified: Secondary | ICD-10-CM | POA: Diagnosis not present

## 2018-10-20 DIAGNOSIS — E785 Hyperlipidemia, unspecified: Secondary | ICD-10-CM

## 2018-10-20 DIAGNOSIS — Z8041 Family history of malignant neoplasm of ovary: Secondary | ICD-10-CM

## 2018-10-20 DIAGNOSIS — C50412 Malignant neoplasm of upper-outer quadrant of left female breast: Secondary | ICD-10-CM

## 2018-10-20 DIAGNOSIS — Z79899 Other long term (current) drug therapy: Secondary | ICD-10-CM | POA: Diagnosis not present

## 2018-10-20 DIAGNOSIS — Z170 Estrogen receptor positive status [ER+]: Secondary | ICD-10-CM

## 2018-10-20 DIAGNOSIS — Z7984 Long term (current) use of oral hypoglycemic drugs: Secondary | ICD-10-CM

## 2018-10-20 DIAGNOSIS — F419 Anxiety disorder, unspecified: Secondary | ICD-10-CM | POA: Diagnosis not present

## 2018-10-20 DIAGNOSIS — Z803 Family history of malignant neoplasm of breast: Secondary | ICD-10-CM | POA: Diagnosis not present

## 2018-10-20 DIAGNOSIS — E119 Type 2 diabetes mellitus without complications: Secondary | ICD-10-CM

## 2018-10-20 DIAGNOSIS — Z79811 Long term (current) use of aromatase inhibitors: Secondary | ICD-10-CM | POA: Insufficient documentation

## 2018-10-20 LAB — CBC WITH DIFFERENTIAL/PLATELET
Abs Immature Granulocytes: 0.03 10*3/uL (ref 0.00–0.07)
Basophils Absolute: 0 10*3/uL (ref 0.0–0.1)
Basophils Relative: 0 %
Eosinophils Absolute: 0.2 10*3/uL (ref 0.0–0.5)
Eosinophils Relative: 3 %
HCT: 40.5 % (ref 36.0–46.0)
Hemoglobin: 13.4 g/dL (ref 12.0–15.0)
Immature Granulocytes: 1 %
Lymphocytes Relative: 28 %
Lymphs Abs: 1.7 10*3/uL (ref 0.7–4.0)
MCH: 30.6 pg (ref 26.0–34.0)
MCHC: 33.1 g/dL (ref 30.0–36.0)
MCV: 92.5 fL (ref 80.0–100.0)
Monocytes Absolute: 0.4 10*3/uL (ref 0.1–1.0)
Monocytes Relative: 7 %
Neutro Abs: 3.6 10*3/uL (ref 1.7–7.7)
Neutrophils Relative %: 61 %
Platelets: 151 10*3/uL (ref 150–400)
RBC: 4.38 MIL/uL (ref 3.87–5.11)
RDW: 13.1 % (ref 11.5–15.5)
WBC: 5.9 10*3/uL (ref 4.0–10.5)
nRBC: 0 % (ref 0.0–0.2)

## 2018-10-20 LAB — COMPREHENSIVE METABOLIC PANEL
ALT: 26 U/L (ref 0–44)
AST: 21 U/L (ref 15–41)
Albumin: 4.1 g/dL (ref 3.5–5.0)
Alkaline Phosphatase: 60 U/L (ref 38–126)
Anion gap: 12 (ref 5–15)
BUN: 18 mg/dL (ref 8–23)
CO2: 23 mmol/L (ref 22–32)
Calcium: 9.5 mg/dL (ref 8.9–10.3)
Chloride: 101 mmol/L (ref 98–111)
Creatinine, Ser: 0.82 mg/dL (ref 0.44–1.00)
GFR calc Af Amer: >60 mL/min (ref >60)
GFR calc non Af Amer: >60 mL/min (ref >60)
Glucose, Bld: 275 mg/dL — ABNORMAL HIGH (ref 70–99)
Potassium: 3.8 mmol/L (ref 3.5–5.1)
Sodium: 136 mmol/L (ref 135–145)
Total Bilirubin: 1.3 mg/dL — ABNORMAL HIGH (ref 0.3–1.2)
Total Protein: 7.1 g/dL (ref 6.5–8.1)

## 2018-10-20 MED ORDER — ANASTROZOLE 1 MG PO TABS: 1.0000 mg | ORAL_TABLET | Freq: Every day | ORAL | 2 refills | Status: DC

## 2018-10-20 NOTE — Progress Notes (Signed)
Patient here today for follow up.  Patient states no new concerns today  

## 2018-10-20 NOTE — Progress Notes (Signed)
Hematology/Oncology Follow up  note [facility] Telephone:([phone] Fax:(336) [phone]   Patient Care Team: Idelle Crouch, MD as PCP - General (Internal Medicine)  REFERRING PROVIDER: Idelle Crouch, MD CHIEF COMPLAINTS/REASON FOR VISIT:  3 months follow-up for management of breast cancer  HISTORY OF PRESENTING ILLNESS:  Gail Rivas is a  63 y.o.  female with PMH listed below who was referred to me for evaluation of breast cancer. Patient had screening mammogram and ultrasound on 10/15/2017 which showed left breast possible mass warrant further evaluation. Diagnostic breast mammogram on 7/31 2019 showed 6 mm spiculated mass slight distortion in the outer left breast.  Targeted ultrasound was performed showing no definite sonographic correlate is identified in the left breast.  Left axilla is negative for lymphadenopathy.   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  Grade 1, ER PR positive [>90%], HER-2 negative  Family history: Maternal grandmother was diagnosed with ovarian cancer in the 68s.  Father had colon cancer. History of radiation to chest: denies.   # # 11/21/2017 She is s/p lumpectomy and sentinel lymph node biopsy.  Pathology showed 56m invasive mammary carcinoma, grade 1, sentinel lymph node was negative.  stage 1A ER/PR +, HER2 negative.Her case was discussed at tumor board. Consensus was  No chemotherapy needed. adjuvant RT followed by anti estrogen treatment.  # # S/p Adjuvant RT finished 01/31/2018  # Started adjuvant antiestrogen with Letrozole on 11/21/ 2019. 04/04/2018 switch to anastrozole as letrozole is on national back order. INTERVAL HISTORY Gail MAYBERRYis a 63y.o. female who has above history reviewed by me today presents for follow-up visit for management of pT1a N0, stage 1A ER/PR +, HER2 negative Breast cancer Patient takes  letrozole.  Reports tolerating well. Today she voices no new concerns of her breast. She had bilateral diagnostic mammogram done on 10/17/2018.  No mammographic evidence of malignancy.  Otherwise she has no new concerns complaints today.  Review of Systems  Constitutional: Negative for chills, fever, malaise/fatigue and weight loss.  HENT: Negative for nosebleeds and sore throat.   Eyes: Negative for double vision, photophobia and redness.  Respiratory: Negative for cough, shortness of breath and wheezing.   Cardiovascular: Negative for chest pain, palpitations, orthopnea and leg swelling.  Gastrointestinal: Negative for abdominal pain, blood in stool, nausea and vomiting.  Genitourinary: Negative for dysuria.  Musculoskeletal: Negative for back pain, myalgias and neck pain.  Skin: Negative for itching and rash.  Neurological: Negative for dizziness, tingling and tremors.  Endo/Heme/Allergies: Negative for environmental allergies. Does not bruise/bleed easily.  Psychiatric/Behavioral: Negative for depression and hallucinations. The patient is not nervous/anxious.     MEDICAL HISTORY:  Past Medical History:  Diagnosis Date  . Anxiety   . Breast cancer ([place] 10/2017   invasive mammary carcinoma  . Diabetes mellitus without complication ([place]   . Fibrocystic disease of breast   . GERD (gastroesophageal reflux disease)   . Headache   . Hyperlipidemia   . Hypertension   . Personal history of radiation therapy 2019   LEFT lumpectomy  . PONV (postoperative nausea and vomiting)    Colonoscopy  . Sleep apnea     SURGICAL HISTORY: Past Surgical History:  Procedure Laterality Date  . BREAST BIOPSY Left 10/30/2017   invasive mammary carcinoma  . BREAST CYST ASPIRATION Left   . BREAST LUMPECTOMY Left 11/21/2017   invasive mammary carcinoma   . COLONOSCOPY    .  COLONOSCOPY WITH PROPOFOL N/A 05/10/2017   Procedure: COLONOSCOPY WITH PROPOFOL;  Surgeon: Manya Silvas, MD;   Location: [facility] ENDOSCOPY;  Service: Endoscopy;  Laterality: N/A;  . PARTIAL MASTECTOMY WITH NEEDLE LOCALIZATION AND AXILLARY SENTINEL LYMPH NODE BX Left 11/21/2017   Procedure: PARTIAL MASTECTOMY WITH NEEDLE LOCALIZATION AND AXILLARY SENTINEL LYMPH NODE BX;  Surgeon: Benjamine Sprague, DO;  Location: [facility] ORS;  Service: General;  Laterality: Left;  . wisdom teeth removal      SOCIAL HISTORY: Social History   Socioeconomic History  . Marital status: Married    Spouse name: Not on file  . Number of children: Not on file  . Years of education: Not on file  . Highest education level: Not on file  Occupational History  . Not on file  Social Needs  . Financial resource strain: Not on file  . Food insecurity    Worry: Not on file    Inability: Not on file  . Transportation needs    Medical: Not on file    Non-medical: Not on file  Tobacco Use  . Smoking status: Never Smoker  . Smokeless tobacco: Never Used  Substance and Sexual Activity  . Alcohol use: No    Frequency: Never  . Drug use: No  . Sexual activity: Yes  Lifestyle  . Physical activity    Days per week: Not on file    Minutes per session: Not on file  . Stress: Not on file  Relationships  . Social Herbalist on phone: Not on file    Gets together: Not on file    Attends religious service: Not on file    Active member of club or organization: Not on file    Attends meetings of clubs or organizations: Not on file    Relationship status: Not on file  . Intimate partner violence    Fear of current or ex partner: Not on file    Emotionally abused: Not on file    Physically abused: Not on file    Forced sexual activity: Not on file  Other Topics Concern  . Not on file  Social History Narrative  . Not on file    FAMILY HISTORY: Family History  Problem Relation Age of Onset  . Hypertension Mother   . Colon cancer Father   . Liver cancer Father   . Diabetes Father   . Ovarian cancer Maternal Grandmother    . Breast cancer Neg Hx     ALLERGIES:  is allergic to codeine; sulfa antibiotics; versed [midazolam]; and penicillin v.  MEDICATIONS:  Current Outpatient Medications  Medication Sig Dispense Refill  . anastrozole (ARIMIDEX) 1 MG tablet Take 1 tablet (1 mg total) by mouth daily. 90 tablet 2  . calcium elemental as carbonate (BARIATRIC TUMS ULTRA) 400 MG chewable tablet Chew by mouth.    . cetirizine (ZYRTEC) 10 MG tablet Take 10 mg by mouth daily.    . Cholecalciferol (VITAMIN D-1000 MAX ST) 25 MCG (1000 UT) tablet Take 1,000 Units by mouth daily.    . clotrimazole-betamethasone (LOTRISONE) cream Apply 1 application topically daily as needed (eczema).     Marland Kitchen glimepiride (AMARYL) 4 MG tablet Take 4 mg by mouth 2 (two) times daily.    . Lactobacillus Rhamnosus, GG, ([employer]S PROBIOTIC, LACTOBACILLUS, PO) Take 1 capsule by mouth daily as needed (takes with acidic foods).     . metFORMIN (GLUCOPHAGE) 1000 MG tablet Take 1,000 mg by mouth 2 (two) times daily with  a meal.    . metoprolol (TOPROL-XL) 200 MG 24 hr tablet Take 200 mg by mouth daily.    . pioglitazone (ACTOS) 45 MG tablet Take 45 mg by mouth daily.    . sitaGLIPtin (JANUVIA) 100 MG tablet Take 100 mg by mouth daily.    Marland Kitchen telmisartan-hydrochlorothiazide (MICARDIS HCT) 80-25 MG tablet Take 1 tablet by mouth daily. Take 1 tablet by mouth daily      No current facility-administered medications for this visit.      PHYSICAL EXAMINATION: ECOG PERFORMANCE STATUS: 0 - Asymptomatic Vitals:   10/20/18 1135  BP: 132/79  Pulse: 61  Temp: (!) 97 F (36.1 C)   Filed Weights   10/20/18 1135  Weight: 163 lb (73.9 kg)    Physical Exam Constitutional:      General: She is not in acute distress.    Appearance: She is well-developed.  HENT:     Head: Normocephalic and atraumatic.     Right Ear: External ear normal.     Left Ear: External ear normal.  Eyes:     General: No scleral icterus.    Pupils: Pupils are equal, round, and reactive  to light.  Neck:     Musculoskeletal: Normal range of motion and neck supple.  Cardiovascular:     Rate and Rhythm: Normal rate and regular rhythm.     Heart sounds: Normal heart sounds.  Pulmonary:     Effort: Pulmonary effort is normal. No respiratory distress.     Breath sounds: No wheezing.  Abdominal:     General: Bowel sounds are normal. There is no distension.     Palpations: Abdomen is soft. There is no mass.     Tenderness: There is no abdominal tenderness.  Musculoskeletal: Normal range of motion.        General: No deformity.  Skin:    General: Skin is warm and dry.     Findings: No erythema or rash.  Neurological:     Mental Status: She is alert and oriented to person, place, and time.     Cranial Nerves: No cranial nerve deficit.     Coordination: Coordination normal.  Psychiatric:        Mood and Affect: Mood normal.        Behavior: Behavior normal.        Thought Content: Thought content normal.   Breast exam was performed in seated and lying down position. Patient is status post left lumpectomy with a well-healed surgical scar.  No evidence of any palpable masses bilaterally. No evidence of bilateral axillary adenopathy.`      LABORATORY DATA:  I have reviewed the data as listed Lab Results  Component Value Date   WBC 5.9 10/20/2018   HGB 13.4 10/20/2018   HCT 40.5 10/20/2018   MCV 92.5 10/20/2018   PLT 151 10/20/2018   Recent Labs    01/09/18 1415 07/07/18 1327 10/20/18 0933  NA 137 137 136  K 3.6 4.4 3.8  CL 102 102 101  CO2 '24 23 23  '$ GLUCOSE 230* 159* 275*  BUN '12 18 18  '$ CREATININE 0.81 0.79 0.82  CALCIUM 9.5 9.2 9.5  GFRNONAA >60 >60 >60  GFRAA >60 >60 >60  PROT 6.8 7.1 7.1  ALBUMIN 3.9 4.2 4.1  AST '23 23 21  '$ ALT '24 31 26  '$ ALKPHOS 61 64 60  BILITOT 0.7 1.1 1.3*   Iron/TIBC/Ferritin/ %Sat No results found for: IRON, TIBC, FERRITIN, IRONPCTSAT  ASSESSMENT & PLAN:  1. Malignant neoplasm of upper-outer quadrant of left  breast in female, estrogen receptor positive ([place])   2. Aromatase inhibitor use   3. Family history of ovarian cancer   Cancer Staging Malignant neoplasm of upper-outer quadrant of left breast in female, estrogen receptor positive ([place]) Staging form: Breast, AJCC 8th Edition - Clinical: No stage assigned - Unsigned - Pathologic stage from 12/05/2017: Stage IA (pT1a, pN0, cM0, G1, ER+, PR+, HER2-) - Signed by Earlie Server, MD on 12/05/2017   # Breast Cancer, pT1a N0, stage 1A ER/PR +, HER2 negative.  She tolerates adjuvant anastrozole well. Labs reviewed and discussed with patient. Anastrozole refused sent to pharmacy. 10/17/2018 bilateral diagnostic mammogram was reviewed and results were discussed with patient.  Baseline bone density was normal.  Advised patient to continue take calcium 1000 mg with vitamin D 800 units daily.   Family history of ovarian cancer, previously referred to genetic counselor.  She was previously referred to genetic counselor and she canceled her appointments.  I had another discussion with patient today.  Patient is not interested  We spent sufficient time to discuss many aspect of care, questions were answered to patient's satisfaction.  Orders Placed This Encounter  Procedures  . Comprehensive metabolic panel    Standing Status:   Future    Standing Expiration Date:   10/20/2019  . CBC with Differential/Platelet    Standing Status:   Future    Standing Expiration Date:   10/20/2019    All questions were answered. The patient knows to call the clinic with any problems questions or concerns.  Return of visit: 3 months   Earlie Server, MD, PhD Hematology Oncology [facility] at [facility] Pager- [phone] 10/20/2018

## 2019-01-19 ENCOUNTER — Inpatient Hospital Stay (HOSPITAL_BASED_OUTPATIENT_CLINIC_OR_DEPARTMENT_OTHER): Payer: BC Managed Care – PPO | Admitting: Oncology

## 2019-01-19 ENCOUNTER — Other Ambulatory Visit: Payer: Self-pay

## 2019-01-19 ENCOUNTER — Encounter: Payer: Self-pay | Admitting: Oncology

## 2019-01-19 ENCOUNTER — Inpatient Hospital Stay: Payer: BC Managed Care – PPO | Attending: Oncology

## 2019-01-19 VITALS — BP 127/82 | HR 63 | Temp 96.7°F | Resp 16 | Wt 162.7 lb

## 2019-01-19 DIAGNOSIS — Z8041 Family history of malignant neoplasm of ovary: Secondary | ICD-10-CM | POA: Diagnosis not present

## 2019-01-19 DIAGNOSIS — Z79811 Long term (current) use of aromatase inhibitors: Secondary | ICD-10-CM | POA: Diagnosis not present

## 2019-01-19 DIAGNOSIS — Z170 Estrogen receptor positive status [ER+]: Secondary | ICD-10-CM | POA: Diagnosis not present

## 2019-01-19 DIAGNOSIS — C50412 Malignant neoplasm of upper-outer quadrant of left female breast: Secondary | ICD-10-CM

## 2019-01-19 LAB — CBC WITH DIFFERENTIAL/PLATELET
Abs Immature Granulocytes: 0.03 10*3/uL (ref 0.00–0.07)
Basophils Absolute: 0 10*3/uL (ref 0.0–0.1)
Basophils Relative: 0 %
Eosinophils Absolute: 0.1 10*3/uL (ref 0.0–0.5)
Eosinophils Relative: 2 %
HCT: 40 % (ref 36.0–46.0)
Hemoglobin: 13.1 g/dL (ref 12.0–15.0)
Immature Granulocytes: 1 %
Lymphocytes Relative: 29 %
Lymphs Abs: 1.8 10*3/uL (ref 0.7–4.0)
MCH: 30.2 pg (ref 26.0–34.0)
MCHC: 32.8 g/dL (ref 30.0–36.0)
MCV: 92.2 fL (ref 80.0–100.0)
Monocytes Absolute: 0.5 10*3/uL (ref 0.1–1.0)
Monocytes Relative: 8 %
Neutro Abs: 3.8 10*3/uL (ref 1.7–7.7)
Neutrophils Relative %: 60 %
Platelets: 155 10*3/uL (ref 150–400)
RBC: 4.34 MIL/uL (ref 3.87–5.11)
RDW: 12.9 % (ref 11.5–15.5)
WBC: 6.2 10*3/uL (ref 4.0–10.5)
nRBC: 0 % (ref 0.0–0.2)

## 2019-01-19 LAB — COMPREHENSIVE METABOLIC PANEL
ALT: 23 U/L (ref 0–44)
AST: 22 U/L (ref 15–41)
Albumin: 3.9 g/dL (ref 3.5–5.0)
Alkaline Phosphatase: 60 U/L (ref 38–126)
Anion gap: 8 (ref 5–15)
BUN: 14 mg/dL (ref 8–23)
CO2: 26 mmol/L (ref 22–32)
Calcium: 9.3 mg/dL (ref 8.9–10.3)
Chloride: 103 mmol/L (ref 98–111)
Creatinine, Ser: 0.75 mg/dL (ref 0.44–1.00)
GFR calc Af Amer: >60 mL/min (ref >60)
GFR calc non Af Amer: >60 mL/min (ref >60)
Glucose, Bld: 149 mg/dL — ABNORMAL HIGH (ref 70–99)
Potassium: 4.2 mmol/L (ref 3.5–5.1)
Sodium: 137 mmol/L (ref 135–145)
Total Bilirubin: 1.3 mg/dL — ABNORMAL HIGH (ref 0.3–1.2)
Total Protein: 6.6 g/dL (ref 6.5–8.1)

## 2019-01-19 NOTE — Progress Notes (Signed)
Hematology/Oncology Follow up  note [facility] Telephone:(336) [phone] Fax:(336) [phone]   Patient Care Team: Idelle Crouch, MD as PCP - General (Internal Medicine)  REFERRING PROVIDER: Idelle Crouch, MD CHIEF COMPLAINTS/REASON FOR VISIT:  3 months follow-up for management of breast cancer  HISTORY OF PRESENTING ILLNESS:  Gail Rivas is a  63 y.o.  female with PMH listed below who was referred to me for evaluation of breast cancer. Patient had screening mammogram and ultrasound on 10/15/2017 which showed left breast possible mass warrant further evaluation. Diagnostic breast mammogram on 7/31 2019 showed 6 mm spiculated mass slight distortion in the outer left breast.  Targeted ultrasound was performed showing no definite sonographic correlate is identified in the left breast.  Left axilla is negative for lymphadenopathy.   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  Grade 1, ER PR positive [>90%], HER-2 negative  Family history: Maternal grandmother was diagnosed with ovarian cancer in the 87s.  Father had colon cancer. History of radiation to chest: denies.   # # 11/21/2017 She is s/p lumpectomy and sentinel lymph node biopsy.  Pathology showed 24m invasive mammary carcinoma, grade 1, sentinel lymph node was negative.  stage 1A ER/PR +, HER2 negative.Her case was discussed at tumor board. Consensus was  No chemotherapy needed. adjuvant RT followed by anti estrogen treatment.  # # S/p Adjuvant RT finished 01/31/2018  # Started adjuvant antiestrogen with Letrozole on 11/21/ 2019. 04/04/2018 switch to anastrozole as letrozole is on national back order. # bilateral diagnostic mammogram done on 10/17/2018.  No mammographic evidence of malignancy. INTERVAL HISTORY Gail CREMEANSis a 63y.o. female who has above history reviewed by me today presents for follow-up  visit for management of pT1a N0, stage 1A ER/PR +, HER2 negative Breast cancer Patient has been on Arimidex '1mg'$  daily.  Reports tolerating well. Today she reports that the left breast lumpectomy site " feels like cardboard".  Denies any pain or other concerns.  Otherwise she has no new concerns complaints today.  Review of Systems  Constitutional: Negative for chills, fever, malaise/fatigue and weight loss.  HENT: Negative for nosebleeds and sore throat.   Eyes: Negative for double vision, photophobia and redness.  Respiratory: Negative for cough, shortness of breath and wheezing.   Cardiovascular: Negative for chest pain, palpitations, orthopnea and leg swelling.  Gastrointestinal: Negative for abdominal pain, blood in stool, nausea and vomiting.  Genitourinary: Negative for dysuria.  Musculoskeletal: Negative for back pain, myalgias and neck pain.  Skin: Negative for itching and rash.  Neurological: Negative for dizziness, tingling and tremors.  Endo/Heme/Allergies: Negative for environmental allergies. Does not bruise/bleed easily.  Psychiatric/Behavioral: Negative for depression and hallucinations. The patient is not nervous/anxious.     MEDICAL HISTORY:  Past Medical History:  Diagnosis Date   Anxiety    Breast cancer ([place] 10[place]   invasive mammary carcinoma   Diabetes mellitus without complication ([place]    Fibrocystic disease of breast    GERD (gastroesophageal reflux disease)    Headache    Hyperlipidemia    Hypertension    Personal history of radiation therapy 2019   LEFT lumpectomy   PONV (postoperative nausea and vomiting)    Colonoscopy   Sleep apnea     SURGICAL HISTORY: Past Surgical History:  Procedure Laterality Date   BREAST BIOPSY Left 10/30/2017   invasive mammary carcinoma   BREAST CYST ASPIRATION Left  BREAST LUMPECTOMY Left 11/21/2017   invasive mammary carcinoma    COLONOSCOPY     COLONOSCOPY WITH PROPOFOL N/A 05/10/2017    Procedure: COLONOSCOPY WITH PROPOFOL;  Surgeon: Manya Silvas, MD;  Location: [facility] ENDOSCOPY;  Service: Endoscopy;  Laterality: N/A;   PARTIAL MASTECTOMY WITH NEEDLE LOCALIZATION AND AXILLARY SENTINEL LYMPH NODE BX Left 11/21/2017   Procedure: PARTIAL MASTECTOMY WITH NEEDLE LOCALIZATION AND AXILLARY SENTINEL LYMPH NODE BX;  Surgeon: Benjamine Sprague, DO;  Location: [facility] ORS;  Service: General;  Laterality: Left;   wisdom teeth removal      SOCIAL HISTORY: Social History   Socioeconomic History   Marital status: Married    Spouse name: Not on file   Number of children: Not on file   Years of education: Not on file   Highest education level: Not on file  Occupational History   Not on file  Social Needs   Financial resource strain: Not on file   Food insecurity    Worry: Not on file    Inability: Not on file   Transportation needs    Medical: Not on file    Non-medical: Not on file  Tobacco Use   Smoking status: Never Smoker   Smokeless tobacco: Never Used  Substance and Sexual Activity   Alcohol use: No    Frequency: Never   Drug use: No   Sexual activity: Yes  Lifestyle   Physical activity    Days per week: Not on file    Minutes per session: Not on file   Stress: Not on file  Relationships   Social connections    Talks on phone: Not on file    Gets together: Not on file    Attends religious service: Not on file    Active member of club or organization: Not on file    Attends meetings of clubs or organizations: Not on file    Relationship status: Not on file   Intimate partner violence    Fear of current or ex partner: Not on file    Emotionally abused: Not on file    Physically abused: Not on file    Forced sexual activity: Not on file  Other Topics Concern   Not on file  Social History Narrative   Not on file    FAMILY HISTORY: Family History  Problem Relation Age of Onset   Hypertension Mother    Colon cancer Father    Liver  cancer Father    Diabetes Father    Ovarian cancer Maternal Grandmother    Breast cancer Neg Hx     ALLERGIES:  is allergic to codeine; sulfa antibiotics; versed [midazolam]; and penicillin v.  MEDICATIONS:  Current Outpatient Medications  Medication Sig Dispense Refill   anastrozole (ARIMIDEX) 1 MG tablet Take 1 tablet (1 mg total) by mouth daily. 90 tablet 2   calcium elemental as carbonate (BARIATRIC TUMS ULTRA) 400 MG chewable tablet Chew by mouth.     cetirizine (ZYRTEC) 10 MG tablet Take 10 mg by mouth daily.     Cholecalciferol (VITAMIN D-1000 MAX ST) 25 MCG (1000 UT) tablet Take 1,000 Units by mouth daily.     clotrimazole-betamethasone (LOTRISONE) cream Apply 1 application topically daily as needed (eczema).      glimepiride (AMARYL) 4 MG tablet Take 4 mg by mouth 2 (two) times daily.     metFORMIN (GLUCOPHAGE) 1000 MG tablet Take 1,000 mg by mouth 2 (two) times daily with a meal.     metoprolol (  TOPROL-XL) 200 MG 24 hr tablet Take 200 mg by mouth daily.     pioglitazone (ACTOS) 45 MG tablet Take 45 mg by mouth daily.     sitaGLIPtin (JANUVIA) 100 MG tablet Take 100 mg by mouth daily.     telmisartan-hydrochlorothiazide (MICARDIS HCT) 80-25 MG tablet Take 1 tablet by mouth daily. Take 1 tablet by mouth daily      Lactobacillus Rhamnosus, GG, (CVS PROBIOTIC, LACTOBACILLUS, PO) Take 1 capsule by mouth daily as needed (takes with acidic foods).      No current facility-administered medications for this visit.      PHYSICAL EXAMINATION: ECOG PERFORMANCE STATUS: 0 - Asymptomatic Vitals:   01/19/19 0944  BP: 127/82  Pulse: 63  Resp: 16  Temp: (!) 96.7 F (35.9 C)   Filed Weights   01/19/19 0944  Weight: 162 lb 11.2 oz (73.8 kg)    Physical Exam Constitutional:      General: She is not in acute distress.    Appearance: She is well-developed.  HENT:     Head: Normocephalic and atraumatic.     Right Ear: External ear normal.     Left Ear: External ear  normal.  Eyes:     General: No scleral icterus.    Pupils: Pupils are equal, round, and reactive to light.  Neck:     Musculoskeletal: Normal range of motion and neck supple.  Cardiovascular:     Rate and Rhythm: Normal rate and regular rhythm.     Heart sounds: Normal heart sounds.  Pulmonary:     Effort: Pulmonary effort is normal. No respiratory distress.     Breath sounds: No wheezing.  Abdominal:     General: Bowel sounds are normal. There is no distension.     Palpations: Abdomen is soft. There is no mass.     Tenderness: There is no abdominal tenderness.  Musculoskeletal: Normal range of motion.        General: No deformity.  Skin:    General: Skin is warm and dry.     Findings: No erythema or rash.  Neurological:     Mental Status: She is alert and oriented to person, place, and time.     Cranial Nerves: No cranial nerve deficit.     Coordination: Coordination normal.  Psychiatric:        Mood and Affect: Mood normal.        Behavior: Behavior normal.        Thought Content: Thought content normal.   Breast exam was performed in seated and lying down position. Patient is status post left lumpectomy with a well-healed surgical scar, local fibrotic changes.  No evidence of any palpable masses bilaterally. No evidence of bilateral axillary adenopathy.`       LABORATORY DATA:  I have reviewed the data as listed Lab Results  Component Value Date   WBC 6.2 01/19/2019   HGB 13.1 01/19/2019   HCT 40.0 01/19/2019   MCV 92.2 01/19/2019   PLT 155 01/19/2019   Recent Labs    07/07/18 1327 10/20/18 0933 01/19/19 0845  NA 137 136 137  K 4.4 3.8 4.2  CL 102 101 103  CO2 '23 23 26  '$ GLUCOSE 159* 275* 149*  BUN '18 18 14  '$ CREATININE 0.79 0.82 0.75  CALCIUM 9.2 9.5 9.3  GFRNONAA >60 >60 >60  GFRAA >60 >60 >60  PROT 7.1 7.1 6.6  ALBUMIN 4.2 4.1 3.9  AST '23 21 22  '$ ALT 31 26 23  ALKPHOS 64 60 60  BILITOT 1.1 1.3* 1.3*   Iron/TIBC/Ferritin/ %Sat No results found  for: IRON, TIBC, FERRITIN, IRONPCTSAT   RADIOGRAPHIC STUDIES: I have personally reviewed the radiological images as listed and agreed with the findings in the report. No results found.    ASSESSMENT & PLAN:  1. Malignant neoplasm of upper-outer quadrant of left breast in female, estrogen receptor positive ([place])   2. Aromatase inhibitor use   3. Family history of ovarian cancer   Cancer Staging Malignant neoplasm of upper-outer quadrant of left breast in female, estrogen receptor positive ([place]) Staging form: Breast, AJCC 8th Edition - Clinical: No stage assigned - Unsigned - Pathologic stage from 12/05/2017: Stage IA (pT1a, pN0, cM0, G1, ER+, PR+, HER2-) - Signed by Earlie Server, MD on 12/05/2017   # Breast Cancer, pT1a N0, stage 1A ER/PR +, HER2 negative.  Patient tolerates adjuvant Arimidex 1 mg daily. Labs reviewed and discussed with patient. Counts are stable. Bilateral diagnostic mammogram 10/17/2018 - for recurrence Breast examination unremarkable.  She has mild scar tissue of left lumpectomy site.  Reassurance was provided.  Baseline bone density was normal.  Continue calcium and vitamin D supplementation. Family history of ovarian cancer.  Declines genetic testing   We spent sufficient time to discuss many aspect of care, questions were answered to patient's satisfaction.  Orders Placed This Encounter  Procedures   CBC with Differential/Platelet    Standing Status:   Future    Standing Expiration Date:   01/19/2020   Comprehensive metabolic panel    Standing Status:   Future    Standing Expiration Date:   01/19/2020    All questions were answered. The patient knows to call the clinic with any problems questions or concerns.  Return of visit: 3 months   Earlie Server, MD, PhD Hematology Oncology [facility] at [facility] Pager- [phone] 01/19/2019

## 2019-01-19 NOTE — Progress Notes (Signed)
Patient does not offer any problems today.  

## 2019-03-25 ENCOUNTER — Ambulatory Visit
Admission: RE | Admit: 2019-03-25 | Discharge: 2019-03-25 | Disposition: A | Discharge status: Home / Self Care | Payer: BC Managed Care – PPO | Source: Ambulatory Visit | Attending: Radiation Oncology | Admitting: Radiation Oncology

## 2019-03-25 ENCOUNTER — Other Ambulatory Visit: Payer: Self-pay

## 2019-03-25 ENCOUNTER — Encounter: Payer: Self-pay | Admitting: Radiation Oncology

## 2019-03-25 DIAGNOSIS — Z170 Estrogen receptor positive status [ER+]: Secondary | ICD-10-CM | POA: Diagnosis not present

## 2019-03-25 DIAGNOSIS — C50412 Malignant neoplasm of upper-outer quadrant of left female breast: Secondary | ICD-10-CM

## 2019-03-25 DIAGNOSIS — Z923 Personal history of irradiation: Secondary | ICD-10-CM | POA: Diagnosis not present

## 2019-03-25 DIAGNOSIS — Z79811 Long term (current) use of aromatase inhibitors: Secondary | ICD-10-CM | POA: Diagnosis not present

## 2019-03-25 NOTE — Progress Notes (Signed)
Radiation Oncology Follow up Note  Name: Gail Rivas   Date:   03/25/2019 MRN:  [id number] DOB: [birth year]    This 64 y.o. female presents to the clinic today for 1 year follow-up status post whole breast radiation to her left breast for stage I ER/PR positive invasive mammary carcinoma.  REFERRING PROVIDER: Idelle Crouch, MD  HPI: Patient is a 63 year old female now at 1 year having completed whole breast radiation to her left breast for stage I ER/PR positive invasive mammary carcinoma.  Seen today in routine follow-up she is doing well.  She specifically denies breast tenderness cough or bone pain..  She had mammograms back in July which I have reviewed were BI-RADS 2 benign.  She continues on Arimidex without side effect.  COMPLICATIONS OF TREATMENT: none  FOLLOW UP COMPLIANCE: keeps appointments   PHYSICAL EXAM:  BP (P) 125/67 (BP Location: Left Arm, Patient Position: Sitting)   Pulse (P) 70   Temp (!) (P) 97.1 F (36.2 C)   Resp (P) 18   Wt (P) 171 lb 8 oz (77.8 kg)   BMI (P) 28.54 kg/m  Lungs are clear to A&P cardiac examination essentially unremarkable with regular rate and rhythm. No dominant mass or nodularity is noted in either breast in 2 positions examined. Incision is well-healed. No axillary or supraclavicular adenopathy is appreciated. Cosmetic result is excellent.  Well-developed well-nourished patient in NAD. HEENT reveals PERLA, EOMI, discs not visualized.  Oral cavity is clear. No oral mucosal lesions are identified. Neck is clear without evidence of cervical or supraclavicular adenopathy. Lungs are clear to A&P. Cardiac examination is essentially unremarkable with regular rate and rhythm without murmur rub or thrill. Abdomen is benign with no organomegaly or masses noted. Motor sensory and DTR levels are equal and symmetric in the upper and lower extremities. Cranial nerves II through XII are grossly intact. Proprioception is intact. No peripheral adenopathy  or edema is identified. No motor or sensory levels are noted. Crude visual fields are within normal range.  RADIOLOGY RESULTS: Mammograms reviewed compatible with above-stated findings  PLAN: Present time patient is doing well with no evidence of disease 1 year out from whole breast radiation repeat.  I am pleased with her overall progress.  I have asked to see her back in 1 year for follow-up.  She is already scheduled for follow-up mammograms.  She continues on Arimidex without side effect.  Patient knows to call with any concerns.  I would like to take this opportunity to thank you for allowing me to participate in the care of your patient.Noreene Filbert, MD

## 2019-04-03 ENCOUNTER — Telehealth: Payer: Self-pay

## 2019-04-03 NOTE — Telephone Encounter (Signed)
Per message from Dr. Doy Hutching "Dr. Tasia Catchings, Ms Gail Rivas had labwork done at the [facility] on 03/26/19. She was wondering if she still needs to have labs drawn for you when you see her in 2-3 weeks. Could you let her know?"  Per. Dr. Bernestine Amass additional labs needed. She is ok with labs drawn on 12/31. Lab appt on 1/25 has been cancelled. Patient notified that she will only have MD visit. VIrtual visit offered but patient prefers to be seen in person.

## 2019-04-20 ENCOUNTER — Inpatient Hospital Stay: Payer: BC Managed Care – PPO | Attending: Oncology | Admitting: Oncology

## 2019-04-20 ENCOUNTER — Encounter: Payer: Self-pay | Admitting: Oncology

## 2019-04-20 ENCOUNTER — Other Ambulatory Visit: Payer: Self-pay

## 2019-04-20 ENCOUNTER — Other Ambulatory Visit: Payer: BC Managed Care – PPO

## 2019-04-20 VITALS — BP 123/71 | HR 70 | Temp 96.8°F | Wt 175.1 lb

## 2019-04-20 DIAGNOSIS — Z79811 Long term (current) use of aromatase inhibitors: Secondary | ICD-10-CM | POA: Insufficient documentation

## 2019-04-20 DIAGNOSIS — C50412 Malignant neoplasm of upper-outer quadrant of left female breast: Secondary | ICD-10-CM | POA: Insufficient documentation

## 2019-04-20 DIAGNOSIS — Z170 Estrogen receptor positive status [ER+]: Secondary | ICD-10-CM | POA: Diagnosis not present

## 2019-04-20 MED ORDER — ANASTROZOLE 1 MG PO TABS: 1.0000 mg | ORAL_TABLET | Freq: Every day | ORAL | 1 refills | Status: DC

## 2019-04-20 NOTE — Progress Notes (Signed)
Patient here for follow up. No concerns voiced,  No new breast issues.

## 2019-04-21 NOTE — Progress Notes (Signed)
Hematology/Oncology Follow up  note [facility] Telephone:([phone] Fax:([phone]) [phone]   Patient Care Team: Idelle Crouch, MD as PCP - General (Internal Medicine)  REFERRING PROVIDER: Idelle Crouch, MD CHIEF COMPLAINTS/REASON FOR VISIT:  3 months follow-up for management of breast cancer  HISTORY OF PRESENTING ILLNESS:  Gail Rivas is a  64 y.o.  female with PMH listed below who was referred to me for evaluation of breast cancer. Patient had screening mammogram and ultrasound on 10/15/2017 which showed left breast possible mass warrant further evaluation. Diagnostic breast mammogram on 7/31 2019 showed 6 mm spiculated mass slight distortion in the outer left breast.  Targeted ultrasound was performed showing no definite sonographic correlate is identified in the left breast.  Left axilla is negative for lymphadenopathy.   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  Grade 1, ER PR positive [>90%], HER-2 negative  Family history: Maternal grandmother was diagnosed with ovarian cancer in the 72s.  Father had colon cancer. History of radiation to chest: denies.   # # 11/21/2017 She is s/p lumpectomy and sentinel lymph node biopsy.  Pathology showed 82m invasive mammary carcinoma, grade 1, sentinel lymph node was negative.  stage 1A ER/PR +, HER2 negative.Her case was discussed at tumor board. Consensus was  No chemotherapy needed. adjuvant RT followed by anti estrogen treatment.  # # S/p Adjuvant RT finished 01/31/2018  # Started adjuvant antiestrogen with Letrozole on 11/21/ 2019. 04/04/2018 switch to anastrozole as letrozole is on national back order. # bilateral diagnostic mammogram done on 10/17/2018.  No mammographic evidence of malignancy. INTERVAL HISTORY Gail SWOBODAis a 64y.o. female who has above history reviewed by me today presents for follow-up  visit for management of pT1a N0, stage 1A ER/PR +, HER2 negative Breast cancer Patient has been taking Arimidex 1 mg daily.  She reports tolerating well.  No significant hot flash or joint pain. Denies any breast concerns today.  No new complaints.   Review of Systems  Constitutional: Negative for chills, fever, malaise/fatigue and weight loss.  HENT: Negative for nosebleeds and sore throat.   Eyes: Negative for double vision, photophobia and redness.  Respiratory: Negative for cough, shortness of breath and wheezing.   Cardiovascular: Negative for chest pain, palpitations, orthopnea and leg swelling.  Gastrointestinal: Negative for abdominal pain, blood in stool, nausea and vomiting.  Genitourinary: Negative for dysuria.  Musculoskeletal: Negative for back pain, myalgias and neck pain.  Skin: Negative for itching and rash.  Neurological: Negative for dizziness, tingling and tremors.  Endo/Heme/Allergies: Negative for environmental allergies. Does not bruise/bleed easily.  Psychiatric/Behavioral: Negative for depression and hallucinations. The patient is not nervous/anxious.     MEDICAL HISTORY:  Past Medical History:  Diagnosis Date  . Anxiety   . Breast cancer ([place] 10/2017   invasive mammary carcinoma  . Diabetes mellitus [place] complication [place]   . Fibrocystic disease of breast   . GERD (gastroesophageal reflux disease)   . Headache   . Hyperlipidemia   . Hypertension   . Personal history of radiation therapy 2019   LEFT lumpectomy  . PONV (postoperative nausea and vomiting)    Colonoscopy  . Sleep apnea     SURGICAL HISTORY: Past Surgical History:  Procedure Laterality Date  . BREAST BIOPSY Left 10/30/2017   invasive mammary carcinoma  . BREAST CYST ASPIRATION Left   . BREAST LUMPECTOMY Left 11/21/2017   invasive mammary carcinoma   .  COLONOSCOPY    . COLONOSCOPY WITH PROPOFOL N/A 05/10/2017   Procedure: COLONOSCOPY WITH PROPOFOL;  Surgeon: Manya Silvas,  MD;  Location[facility] ENDOSCOPY;  Service: Endoscopy;  Laterality: N/A;  . PARTIAL MASTECTOMY WITH NEEDLE LOCALIZATION AND AXILLARY SENTINEL LYMPH NODE BX Left 11/21/2017   Procedure: PARTIAL MASTECTOMY WITH NEEDLE LOCALIZATION AND AXILLARY SENTINEL LYMPH NODE BX;  Surgeon: Benjamine Sprague, DO;  Location: [facility] ORS;  Service: General;  Laterality: Left;  . wisdom teeth removal      SOCIAL HISTORY: Social History   Socioeconomic History  . Marital status: Married    Spouse name: Not on file  . Number of children: Not on file  . Years of education: Not on file  . Highest education level: Not on file  Occupational History  . Not on file  Tobacco Use  . Smoking status: Never Smoker  . Smokeless tobacco: Never Used  Substance and Sexual Activity  . Alcohol use: No  . Drug use: No  . Sexual activity: Yes  Other Topics Concern  . Not on file  Social History Narrative  . Not on file   Social Determinants of Health   Financial Resource Strain:   . Difficulty of Paying Living Expenses: Not on file  Food Insecurity:   . Worried About Charity fundraiser in the Last Year: Not on file  . Ran Out of Food in the Last Year: Not on file  Transportation Needs:   . Lack of Transportation (Medical): Not on file  . Lack of Transportation (Non-Medical): Not on file  Physical Activity:   . Days of Exercise per Week: Not on file  . Minutes of Exercise per Session: Not on file  Stress:   . Feeling of Stress : Not on file  Social Connections:   . Frequency of Communication with Friends and Family: Not on file  . Frequency of Social Gatherings with Friends and Family: Not on file  . Attends Religious Services: Not on file  . Active Member of Clubs or Organizations: Not on file  . Attends Archivist Meetings: Not on file  . Marital Status: Not on file  Intimate Partner Violence:   . Fear of Current or Ex-Partner: Not on file  . Emotionally Abused: Not on file  . Physically Abused: Not on  file  . Sexually Abused: Not on file    FAMILY HISTORY: Family History  Problem Relation Age of Onset  . Hypertension Mother   . Colon cancer Father   . Liver cancer Father   . Diabetes Father   . Ovarian cancer Maternal Grandmother   . Breast cancer Neg Hx     ALLERGIES:  is allergic to codeine; sulfa antibiotics; versed [midazolam]; and penicillin v.  MEDICATIONS:  Current Outpatient Medications  Medication Sig Dispense Refill  . anastrozole (ARIMIDEX) 1 MG tablet Take 1 tablet (1 mg total) by mouth daily. 90 tablet 1  . calcium elemental as carbonate (BARIATRIC TUMS ULTRA) 400 MG chewable tablet Chew by mouth.    . cetirizine (ZYRTEC) 10 MG tablet Take 10 mg by mouth daily.    . Cholecalciferol (VITAMIN D-1000 MAX ST) 25 MCG (1000 UT) tablet Take 1,000 Units by mouth daily.    Marland Kitchen glimepiride (AMARYL) 4 MG tablet Take 4 mg by mouth 2 (two) times daily.    . metFORMIN (GLUCOPHAGE) 1000 MG tablet Take 1,000 mg by mouth 2 (two) times daily with a meal.    . metoprolol (TOPROL-XL) 200 MG  24 hr tablet Take 200 mg by mouth daily.    . pioglitazone (ACTOS) 45 MG tablet Take 45 mg by mouth daily.    . sitaGLIPtin (JANUVIA) 100 MG tablet Take 100 mg by mouth daily.    Marland Kitchen telmisartan-hydrochlorothiazide (MICARDIS HCT) 80-25 MG tablet Take 1 tablet by mouth daily. Take 1 tablet by mouth daily     . clotrimazole-betamethasone (LOTRISONE) cream Apply 1 application topically daily as needed (eczema).     . Lactobacillus Rhamnosus, GG, ([employer] PROBIOTIC, LACTOBACILLUS, PO) Take 1 capsule by mouth daily as needed (takes with acidic foods).      No current facility-administered medications for this visit.     PHYSICAL EXAMINATION: ECOG PERFORMANCE STATUS: 0 - Asymptomatic Vitals:   04/20/19 1317  BP: 123/71  Pulse: 70  Temp: (!) 96.8 F (36 C)   Filed Weights   04/20/19 1317  Weight: 175 lb 1.6 oz (79.4 kg)    Physical Exam Constitutional:      General: She is not in acute  distress.    Appearance: She is well-developed.  HENT:     Head: Normocephalic and atraumatic.     Right Ear: External ear normal.     Left Ear: External ear normal.  Eyes:     General: No scleral icterus.    Pupils: Pupils are equal, round, and reactive to light.  Cardiovascular:     Rate and Rhythm: Normal rate and regular rhythm.     Heart sounds: Normal heart sounds.  Pulmonary:     Effort: Pulmonary effort is normal. No respiratory distress.     Breath sounds: No wheezing.  Abdominal:     General: Bowel sounds are normal. There is no distension.     Palpations: Abdomen is soft. There is no mass.     Tenderness: There is no abdominal tenderness.  Musculoskeletal:        General: No deformity. Normal range of motion.     Cervical back: Normal range of motion and neck supple.  Skin:    General: Skin is warm and dry.     Findings: No erythema or rash.  Neurological:     Mental Status: She is alert and oriented to person, place, and time.     Cranial Nerves: No cranial nerve deficit.     Coordination: Coordination normal.  Psychiatric:        Mood and Affect: Mood normal.        Behavior: Behavior normal.        Thought Content: Thought content normal.   Breast exam was performed in seated and lying down position. Patient is status post left lumpectomy with a well-healed surgical scar, local scar tissue.  No evidence of any palpable masses bilaterally. No evidence of bilateral axillary adenopathy.`       LABORATORY DATA:  I have reviewed the data as listed Lab Results  Component Value Date   WBC 6.2 01/19/2019   HGB 13.1 01/19/2019   HCT 40.0 01/19/2019   MCV 92.2 01/19/2019   PLT 155 01/19/2019   Recent Labs    07/07/18 1327 10/20/18 0933 01/19/19 0845  NA 137 136 137  K 4.4 3.8 4.2  CL 102 101 103  CO2 '23 23 26  '$ GLUCOSE 159* 275* 149*  BUN '18 18 14  '$ CREATININE 0.79 0.82 0.75  CALCIUM 9.2 9.5 9.3  GFRNONAA >60 >60 >60  GFRAA >60 >60 >60  PROT 7.1 7.1  6.6  ALBUMIN 4.2 4.1 3.9  AST  $'23 21 22  'f$ ALT '31 26 23  '$ ALKPHOS 64 60 60  BILITOT 1.1 1.3* 1.3*   Iron/TIBC/Ferritin/ %Sat No results found for: IRON, TIBC, FERRITIN, IRONPCTSAT   RADIOGRAPHIC STUDIES: I have personally reviewed the radiological images as listed and agreed with the findings in the report. No results found.    ASSESSMENT & PLAN:  1. Malignant neoplasm of upper-outer quadrant of left breast in female, estrogen receptor positive ([place])   2. Aromatase inhibitor use   Cancer Staging Malignant neoplasm of upper-outer quadrant of left breast in female, estrogen receptor positive ([place]) Staging form: Breast, AJCC 8th Edition - Clinical: No stage assigned - Unsigned - Pathologic stage from 12/05/2017: Stage IA (pT1a, pN0, cM0, G1, ER+, PR+, HER2-) - Signed by Earlie Server, MD on 12/05/2017   # Breast Cancer, pT1a N0, stage 1A ER/PR +, HER2 negative.  Continue Arimidex 1 mg daily. Labs are reviewed and discussed with patient. Counts are stable. Recommend annual diagnostic mammogram bilaterally. Patient is due for mammogram in July 2021.  Will obtain.   #Chronic aromatase inhibitor use, side effects were discussed with patient.   Previous Baseline bone density was normal.  Continue calcium and vitamin D supplementation.   Will obtain repeat bone density in October 2021.  Family history of ovarian cancer.  Declines genetic testing   We spent sufficient time to discuss many aspect of care, questions were answered to patient's satisfaction.  Orders Placed This Encounter  Procedures  . MM DIAG BREAST TOMO BILATERAL    Standing Status:   Future    Standing Expiration Date:   04/19/2020    Order Specific Question:   Reason for Exam (SYMPTOM  OR DIAGNOSIS REQUIRED)    Answer:   Malignant neoplasm of upper-outer quadrant of left breast    Order Specific Question:   Preferred imaging location?    Answer:   [facility]  . US Breast Limited Uni Left Inc Axilla    Standing  Status:   Future    Standing Expiration Date:   06/17/2020    Order Specific Question:   Reason for Exam (SYMPTOM  OR DIAGNOSIS REQUIRED)    Answer:   Malignant neoplasm of upper-outer quadrant of left breast    Order Specific Question:   Preferred imaging location?    Answer:   [facility]  . US Breast Limited Uni Right Inc Axilla    Standing Status:   Future    Standing Expiration Date:   06/17/2020    Order Specific Question:   Reason for Exam (SYMPTOM  OR DIAGNOSIS REQUIRED)    Answer:   Malignant neoplasm of upper-outer quadrant of left breast    Order Specific Question:   Preferred imaging location?    Answer:   [facility]    All questions were answered. The patient knows to call the clinic with any problems questions or concerns.  Return of visit: 6 months   Earlie Server, MD, PhD Hematology Oncology [facility] Pager- [phone] 04/21/2019

## 2019-04-26 ENCOUNTER — Other Ambulatory Visit: Payer: Self-pay | Admitting: Oncology

## 2019-05-30 ENCOUNTER — Ambulatory Visit: Payer: BC Managed Care – PPO | Attending: Internal Medicine

## 2019-05-30 DIAGNOSIS — Z23 Encounter for immunization: Secondary | ICD-10-CM | POA: Insufficient documentation

## 2019-05-30 NOTE — Progress Notes (Signed)
   Covid-19 Vaccination Clinic  Name:  Gail Rivas    MRN: [id number] DOB: February 15, 1956  05/30/2019  [name] was observed post Covid-19 immunization for 15 minutes without incident. She was provided with Vaccine Information Sheet and instruction to access the V-Safe system.   [name] was instructed to call 911 with any severe reactions post vaccine: Marland Kitchen Difficulty breathing  . Swelling of face and throat  . A fast heartbeat  . A bad rash all over body  . Dizziness and weakness   Immunizations Administered    Name Date Dose VIS Date Route   Pfizer COVID-19 Vaccine 05/30/2019 12:08 PM 0.3 mL 03/06/2019 Intramuscular   Manufacturer: [place]   Lot: [id number]   [place]: [id number]

## 2019-06-23 ENCOUNTER — Ambulatory Visit: Payer: BC Managed Care – PPO | Attending: Internal Medicine

## 2019-06-23 DIAGNOSIS — Z23 Encounter for immunization: Secondary | ICD-10-CM

## 2019-06-23 NOTE — Progress Notes (Signed)
   Covid-19 Vaccination Clinic  Name:  Gail Rivas    MRN: [id number] DOB: October 12, 1955  06/23/2019  [name] was observed post Covid-19 immunization for 15 minutes without incident. She was provided with Vaccine Information Sheet and instruction to access the V-Safe system.   [name] was instructed to call 911 with any severe reactions post vaccine: Marland Kitchen Difficulty breathing  . Swelling of face and throat  . A fast heartbeat  . A bad rash all over body  . Dizziness and weakness   Immunizations Administered    Name Date Dose VIS Date Route   Pfizer COVID-19 Vaccine 06/23/2019  3:31 PM 0.3 mL 03/06/2019 Intramuscular   Manufacturer: [place]   Lot: [id number]   [place]: [id number]

## 2019-10-09 IMAGING — MG MM BREAST LOCALIZATION CLIP
5 series · 6 of 13 positions shown · non-contrast
Comparison: Previous exam(s).

CLINICAL DATA: Status post stereotactic biopsy for architectural
distortion within the outer LEFT breast.

EXAM:
DIAGNOSTIC LEFT MAMMOGRAM POST stereotactic BIOPSY

[L CC]
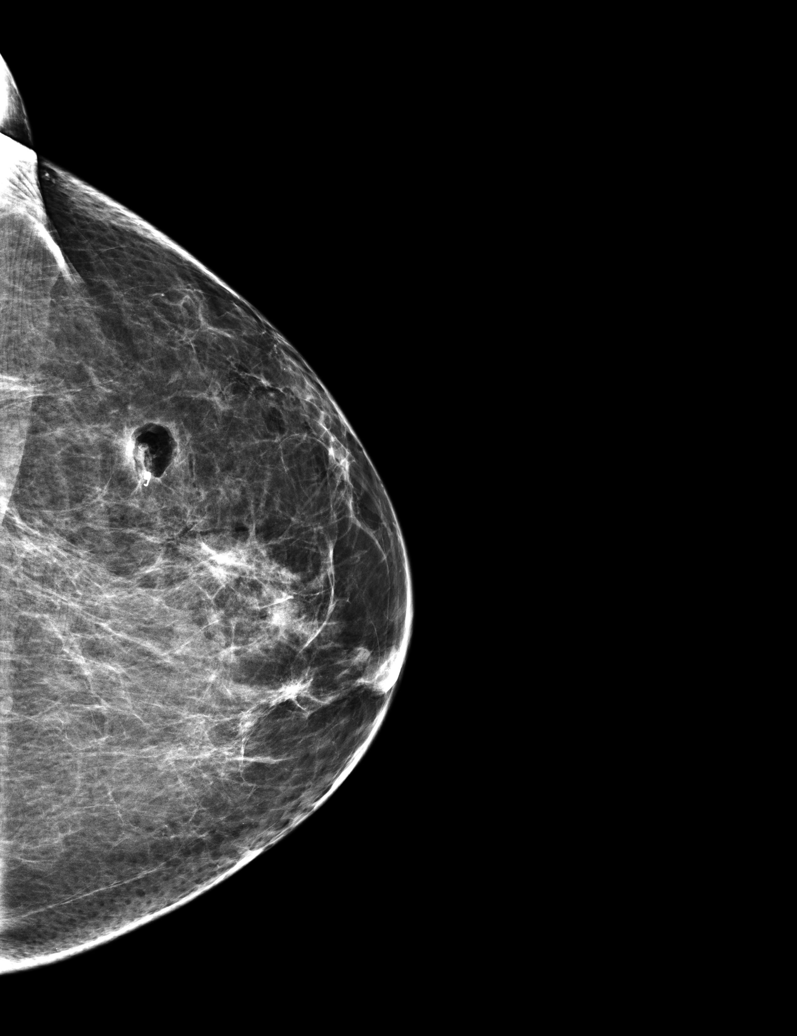

[L CC synth-2D]
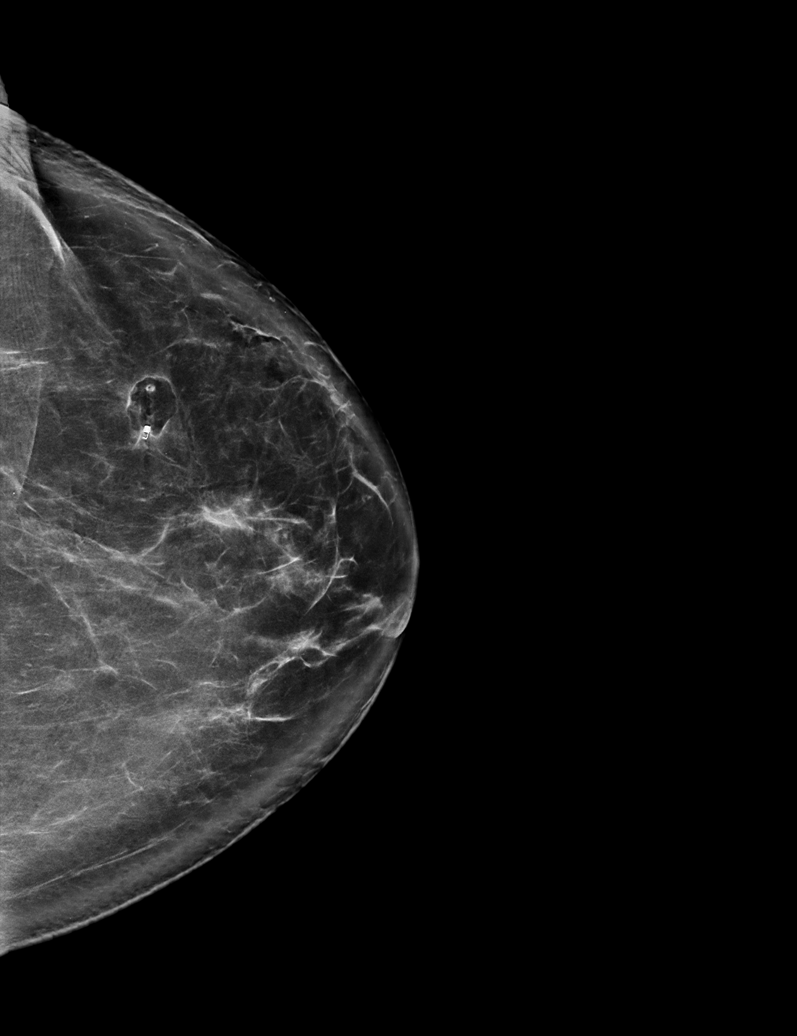

[L ML synth-2D]
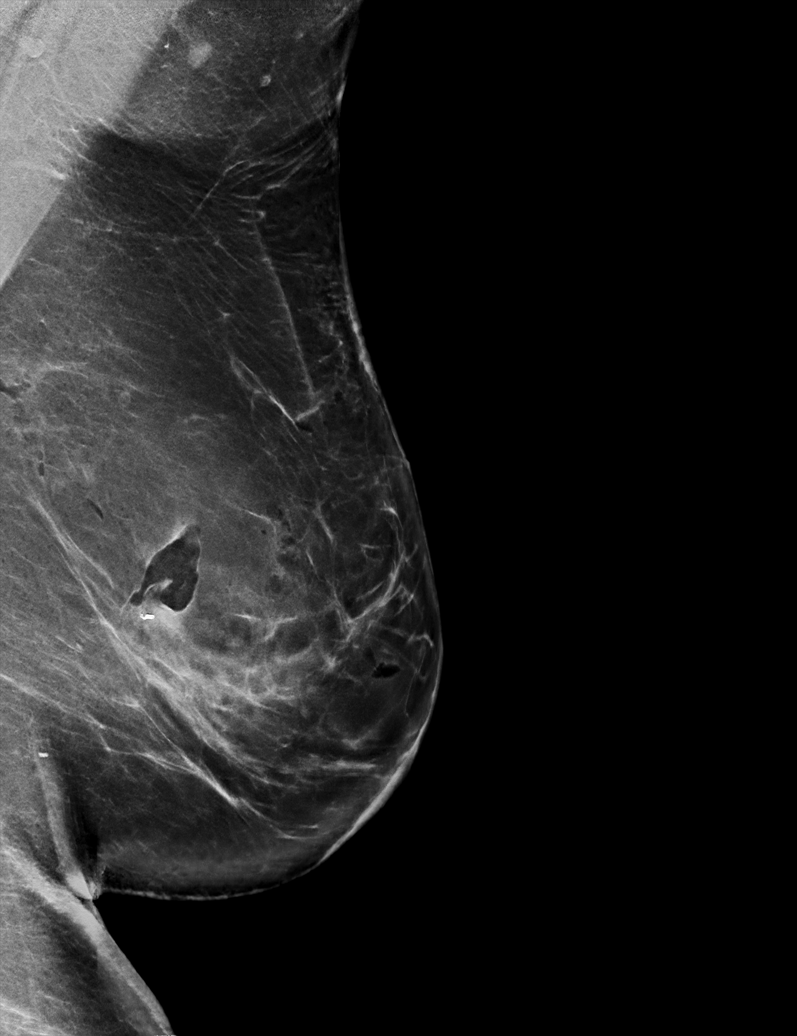

[L CC tomo · 2 of 81 frames shown]
[frame 27/81]
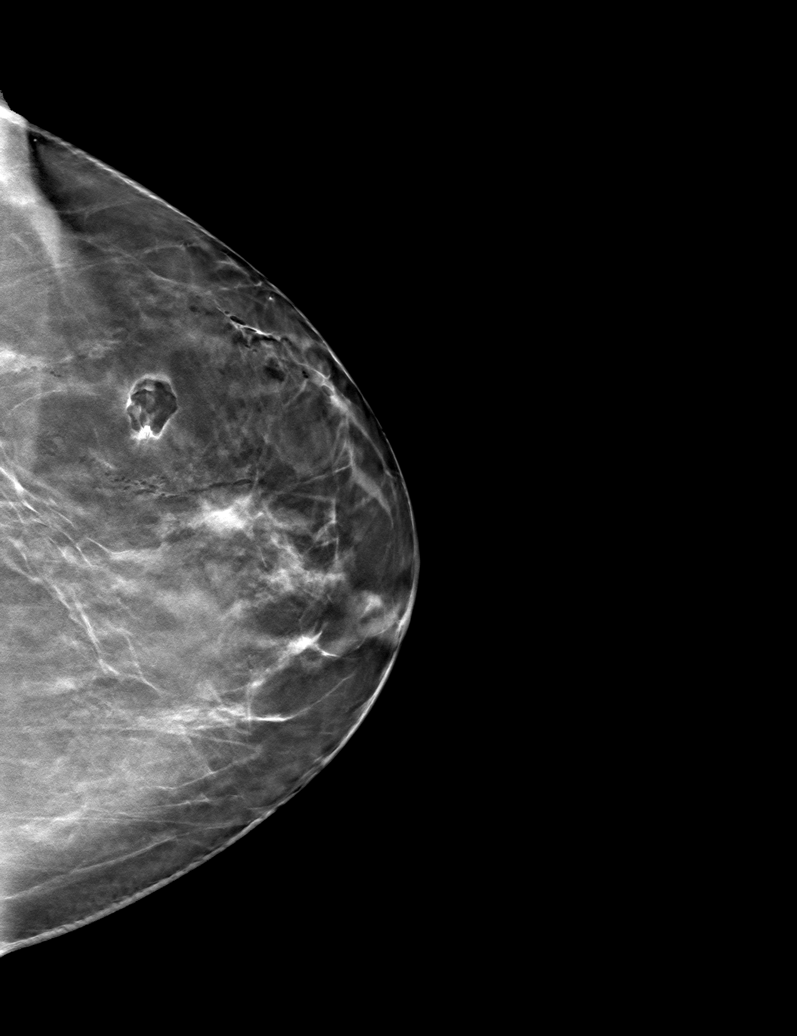
[frame 41/81]
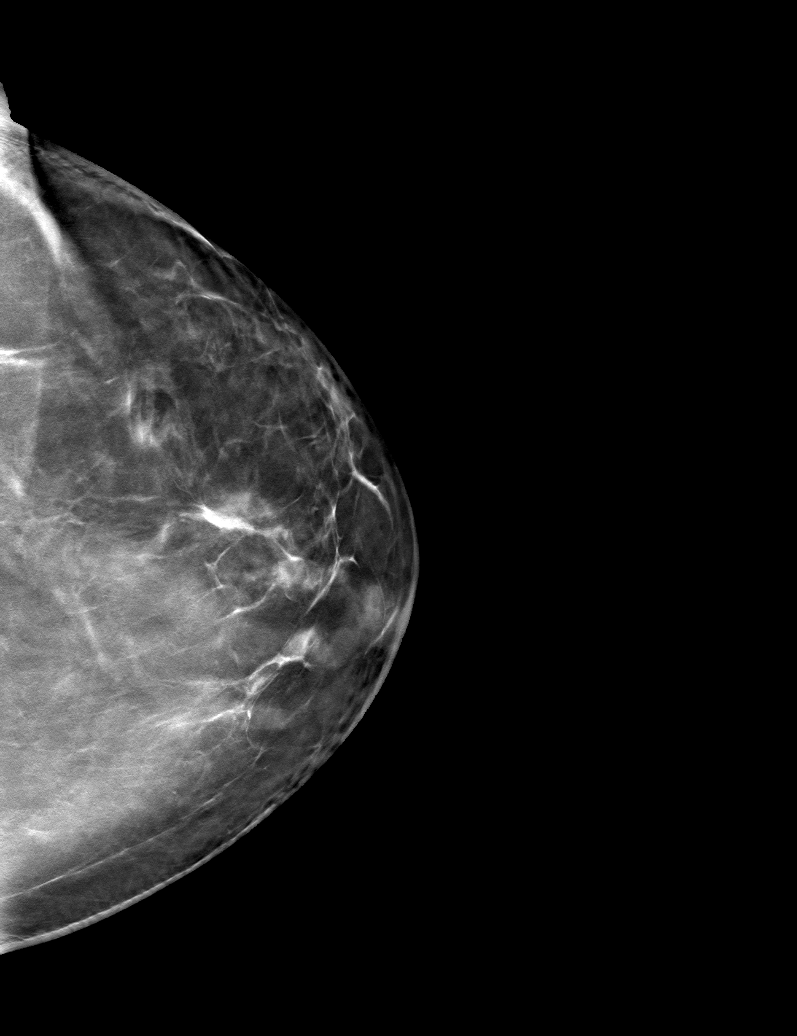

[L ML tomo · tomo slice 49/96.0]
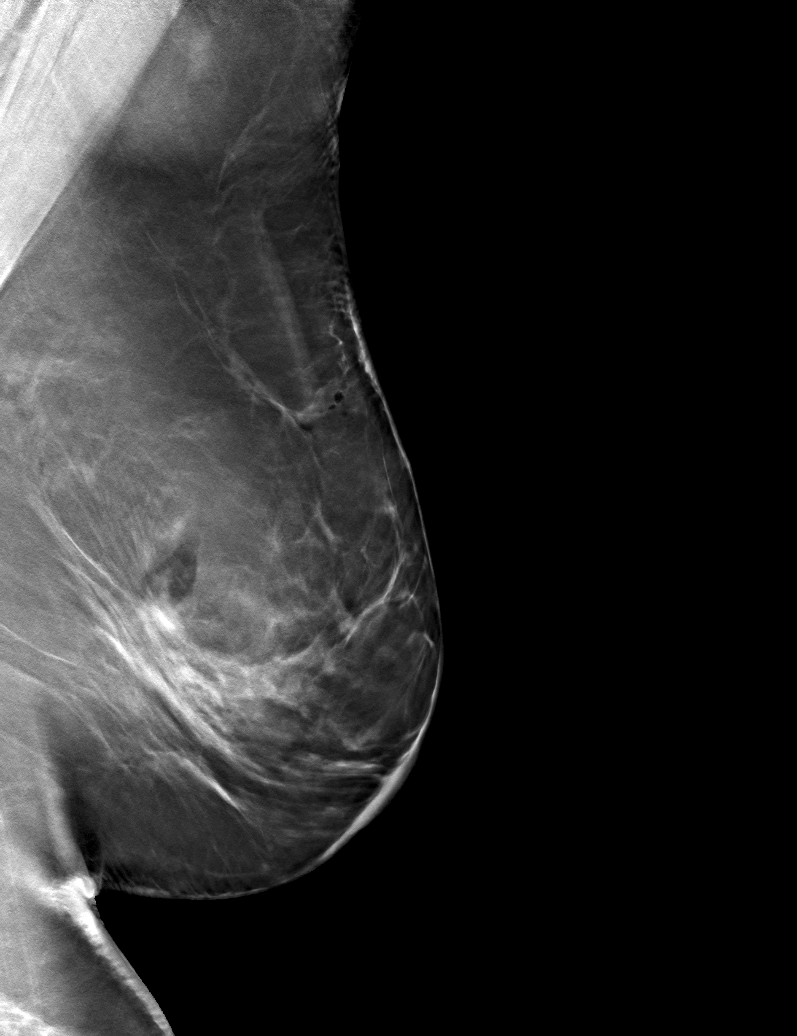

[6 of 13 positions shown; findings below may reference images not displayed]

FINDINGS: Mammographic images were obtained following stereotactic guided
biopsy of architectural distortion within the outer LEFT breast.
Coil shaped clip is well-positioned at the site of the targeted
architectural distortion.
IMPRESSION: Coil shaped biopsy clip is well-positioned at the site of the
targeted architectural distortion within the upper-outer quadrant of
the LEFT breast.

Final Assessment: Post Procedure Mammograms for Marker Placement

## 2019-10-19 ENCOUNTER — Ambulatory Visit
Admission: RE | Admit: 2019-10-19 | Discharge: 2019-10-19 | Disposition: A | Discharge status: Home / Self Care | Payer: BC Managed Care – PPO | Source: Ambulatory Visit | Attending: Oncology | Admitting: Oncology

## 2019-10-19 DIAGNOSIS — Z170 Estrogen receptor positive status [ER+]: Secondary | ICD-10-CM

## 2019-10-19 DIAGNOSIS — C50412 Malignant neoplasm of upper-outer quadrant of left female breast: Secondary | ICD-10-CM

## 2019-10-19 DIAGNOSIS — Z79811 Long term (current) use of aromatase inhibitors: Secondary | ICD-10-CM | POA: Diagnosis present

## 2019-10-21 ENCOUNTER — Inpatient Hospital Stay: Payer: BC Managed Care – PPO | Attending: Oncology | Admitting: Oncology

## 2019-10-21 ENCOUNTER — Other Ambulatory Visit: Payer: Self-pay

## 2019-10-21 ENCOUNTER — Inpatient Hospital Stay: Payer: BC Managed Care – PPO

## 2019-10-21 ENCOUNTER — Encounter: Payer: Self-pay | Admitting: Oncology

## 2019-10-21 VITALS — BP 106/74 | HR 76 | Temp 96.1°F | Resp 16 | Wt 166.0 lb

## 2019-10-21 DIAGNOSIS — Z8041 Family history of malignant neoplasm of ovary: Secondary | ICD-10-CM | POA: Diagnosis not present

## 2019-10-21 DIAGNOSIS — Z7984 Long term (current) use of oral hypoglycemic drugs: Secondary | ICD-10-CM | POA: Diagnosis not present

## 2019-10-21 DIAGNOSIS — K219 Gastro-esophageal reflux disease without esophagitis: Secondary | ICD-10-CM | POA: Insufficient documentation

## 2019-10-21 DIAGNOSIS — Z170 Estrogen receptor positive status [ER+]: Secondary | ICD-10-CM | POA: Diagnosis not present

## 2019-10-21 DIAGNOSIS — Z8249 Family history of ischemic heart disease and other diseases of the circulatory system: Secondary | ICD-10-CM | POA: Diagnosis not present

## 2019-10-21 DIAGNOSIS — Z800 Family history of malignant neoplasm of digestive organs: Secondary | ICD-10-CM | POA: Insufficient documentation

## 2019-10-21 DIAGNOSIS — E119 Type 2 diabetes mellitus without complications: Secondary | ICD-10-CM | POA: Diagnosis not present

## 2019-10-21 DIAGNOSIS — Z833 Family history of diabetes mellitus: Secondary | ICD-10-CM | POA: Diagnosis not present

## 2019-10-21 DIAGNOSIS — Z79899 Other long term (current) drug therapy: Secondary | ICD-10-CM | POA: Insufficient documentation

## 2019-10-21 DIAGNOSIS — I10 Essential (primary) hypertension: Secondary | ICD-10-CM | POA: Insufficient documentation

## 2019-10-21 DIAGNOSIS — Z923 Personal history of irradiation: Secondary | ICD-10-CM | POA: Diagnosis not present

## 2019-10-21 DIAGNOSIS — I2510 Atherosclerotic heart disease of native coronary artery without angina pectoris: Secondary | ICD-10-CM | POA: Insufficient documentation

## 2019-10-21 DIAGNOSIS — Z79811 Long term (current) use of aromatase inhibitors: Secondary | ICD-10-CM | POA: Insufficient documentation

## 2019-10-21 DIAGNOSIS — C50412 Malignant neoplasm of upper-outer quadrant of left female breast: Secondary | ICD-10-CM | POA: Insufficient documentation

## 2019-10-21 DIAGNOSIS — E785 Hyperlipidemia, unspecified: Secondary | ICD-10-CM | POA: Insufficient documentation

## 2019-10-21 MED ORDER — ANASTROZOLE 1 MG PO TABS: 1.0000 mg | ORAL_TABLET | Freq: Every day | ORAL | 1 refills | Status: DC

## 2019-10-21 NOTE — Progress Notes (Signed)
Hematology/Oncology Follow up  note [facility] Telephone:([phone]) [phone] Fax:([phone]) [phone]   Patient Care Team: Idelle Crouch, MD as PCP - General (Internal Medicine)  REFERRING PROVIDER: Idelle Crouch, MD CHIEF COMPLAINTS  follow-up for management of breast cancer  HISTORY OF PRESENTING ILLNESS:  Gail Rivas is a  64 y.o.  female with PMH listed below who was referred to me for  breast cancer. Patient had screening mammogram and ultrasound on 10/15/2017 which showed left breast possible mass warrant further evaluation. Diagnostic breast mammogram on 7/31 2019 showed 6 mm spiculated mass slight distortion in the outer left breast.  Targeted ultrasound was performed showing no definite sonographic correlate is identified in the left breast.  Left axilla is negative for lymphadenopathy.   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  Grade 1, ER PR positive [>90%], HER-2 negative  Family history: Maternal grandmother was diagnosed with ovarian cancer in the 13s.  Father had colon cancer. Patient declined genetic testing.  # # 11/21/2017 She is s/p lumpectomy and sentinel lymph node biopsy.  Pathology showed 50m invasive mammary carcinoma, grade 1, sentinel lymph node was negative.  stage 1A ER/PR +, HER2 negative.Her case was discussed at tumor board. Consensus was  No chemotherapy needed. adjuvant RT followed by anti estrogen treatment.  # # S/p Adjuvant RT finished 01/31/2018  # Started adjuvant antiestrogen with Letrozole on 11/21/ 2019. 04/04/2018 switch to anastrozole as letrozole is on national back order. # bilateral diagnostic mammogram done on 10/17/2018.  No mammographic evidence of malignancy. INTERVAL HISTORY Gail SABINEis a 64y.o. female who has above history reviewed by me today presents for follow-up visit for management of pT1a N0, stage 1A  ER/PR +, HER2 negative Breast cancer Patient has been on Arimidex 1 mg daily.  She reports tolerating well.  Manageable hot flash. She denies any breast concerns today.   She just had surveillance mammogram done recently.   Review of Systems  Constitutional: Negative for chills, fever, malaise/fatigue and weight loss.  HENT: Negative for nosebleeds and sore throat.   Eyes: Negative for double vision, photophobia and redness.  Respiratory: Negative for cough, shortness of breath and wheezing.   Cardiovascular: Negative for chest pain, palpitations, orthopnea and leg swelling.  Gastrointestinal: Negative for abdominal pain, blood in stool, nausea and vomiting.  Genitourinary: Negative for dysuria.  Musculoskeletal: Negative for back pain, myalgias and neck pain.  Skin: Negative for itching and rash.  Neurological: Negative for dizziness, tingling and tremors.  Endo/Heme/Allergies: Negative for environmental allergies. Does not bruise/bleed easily.  Psychiatric/Behavioral: Negative for depression and hallucinations. The patient is not nervous/anxious.     MEDICAL HISTORY:  Past Medical History:  Diagnosis Date  . Anxiety   . [place]   invasive mammary carcinoma- [place]  . [place]   . Fibrocystic disease of breast   . GERD (gastroesophageal reflux disease)   . Headache   . Hyperlipidemia   . Hypertension   . Personal history of radiation therapy 2019   LEFT lumpectomy  . PONV (postoperative nausea and vomiting)    Colonoscopy  . Sleep apnea     SURGICAL HISTORY: Past Surgical History:  Procedure Laterality Date  . BREAST BIOPSY Left 10/30/2017   invasive mammary carcinoma  . BREAST CYST ASPIRATION Left   . BREAST LUMPECTOMY Left 11/21/2017   invasive mammary carcinoma   . COLONOSCOPY    .  COLONOSCOPY WITH PROPOFOL N/A 05/10/2017   Procedure: COLONOSCOPY WITH PROPOFOL;  Surgeon: Manya Silvas, MD;  Location: [facility]  ENDOSCOPY;  Service: Endoscopy;  Laterality: N/A;  . PARTIAL MASTECTOMY WITH NEEDLE LOCALIZATION AND AXILLARY SENTINEL LYMPH NODE BX Left 11/21/2017   Procedure: PARTIAL MASTECTOMY WITH NEEDLE LOCALIZATION AND AXILLARY SENTINEL LYMPH NODE BX;  Surgeon: Benjamine Sprague, DO;  Location: [facility] ORS;  Service: General;  Laterality: Left;  . wisdom teeth removal      SOCIAL HISTORY: Social History   Socioeconomic History  . Marital status: Married    Spouse name: Not on file  . Number of children: Not on file  . Years of education: Not on file  . Highest education level: Not on file  Occupational History  . Not on file  Tobacco Use  . Smoking status: Never Smoker  . Smokeless tobacco: Never Used  Vaping Use  . Vaping Use: Never used  Substance and Sexual Activity  . Alcohol use: No  . Drug use: No  . Sexual activity: Yes  Other Topics Concern  . Not on file  Social History Narrative  . Not on file   Social Determinants of Health   Financial Resource Strain:   . Difficulty of Paying Living Expenses:   Food Insecurity:   . Worried About Charity fundraiser in the Last Year:   . Arboriculturist in the Last Year:   Transportation Needs:   . Film/video editor (Medical):   Marland Kitchen Lack of Transportation (Non-Medical):   Physical Activity:   . Days of Exercise per Week:   . Minutes of Exercise per Session:   Stress:   . Feeling of Stress :   Social Connections:   . Frequency of Communication with Friends and Family:   . Frequency of Social Gatherings with Friends and Family:   . Attends Religious Services:   . Active Member of Clubs or Organizations:   . Attends Archivist Meetings:   Marland Kitchen Marital Status:   Intimate Partner Violence:   . Fear of Current or Ex-Partner:   . Emotionally Abused:   Marland Kitchen Physically Abused:   . Sexually Abused:     FAMILY HISTORY: Family History  Problem Relation Age of Onset  . Hypertension Mother   . Colon cancer Father   . Liver cancer  Father   . Diabetes Father   . Ovarian cancer Maternal Grandmother   . Breast cancer Neg Hx     ALLERGIES:  is allergic to codeine, sulfa antibiotics, versed [midazolam], and penicillin v.  MEDICATIONS:  Current Outpatient Medications  Medication Sig Dispense Refill  . anastrozole (ARIMIDEX) 1 MG tablet Take 1 tablet by mouth once daily 90 tablet 0  . calcium elemental as carbonate (BARIATRIC TUMS ULTRA) 400 MG chewable tablet Chew by mouth.    . cetirizine (ZYRTEC) 10 MG tablet Take 10 mg by mouth daily.    . Cholecalciferol (VITAMIN D-1000 MAX ST) 25 MCG (1000 UT) tablet Take 1,000 Units by mouth daily.    . clotrimazole-betamethasone (LOTRISONE) cream Apply 1 application topically daily as needed (eczema).     Marland Kitchen glimepiride (AMARYL) 4 MG tablet Take 4 mg by mouth 2 (two) times daily.    Marland Kitchen levofloxacin (LEVAQUIN) 500 MG tablet Take by mouth.    . metFORMIN (GLUCOPHAGE) 1000 MG tablet Take 1,000 mg by mouth 2 (two) times daily with a meal.    . metoprolol (TOPROL-XL) 200 MG 24 hr tablet Take 200 mg  by mouth daily.    . pioglitazone (ACTOS) 45 MG tablet Take 45 mg by mouth daily.    . rosuvastatin (CRESTOR) 10 MG tablet Take by mouth.    . sitaGLIPtin (JANUVIA) 100 MG tablet Take 100 mg by mouth daily.    Marland Kitchen telmisartan-hydrochlorothiazide (MICARDIS HCT) 80-25 MG tablet Take 1 tablet by mouth daily. Take 1 tablet by mouth daily     . Lactobacillus Rhamnosus, GG, ([employer] PROBIOTIC, LACTOBACILLUS, PO) Take 1 capsule by mouth daily as needed (takes with acidic foods).  (Patient not taking: Reported on 10/21/2019)     No current facility-administered medications for this visit.     PHYSICAL EXAMINATION: ECOG PERFORMANCE STATUS: 0 - Asymptomatic Vitals:   10/21/19 1024  BP: 106/74  Pulse: 76  Resp: 16  Temp: (!) 96.1 F (35.6 C)   Filed Weights   10/21/19 1024  Weight: 166 lb (75.3 kg)    Physical Exam Constitutional:      General: She is not in acute distress. HENT:     Head:  Normocephalic and atraumatic.  Eyes:     General: No scleral icterus. Cardiovascular:     Rate and Rhythm: Normal rate and regular rhythm.     Heart sounds: Normal heart sounds.  Pulmonary:     Effort: Pulmonary effort is normal. No respiratory distress.     Breath sounds: No wheezing.  Abdominal:     General: Bowel sounds are normal. There is no distension.     Palpations: Abdomen is soft.  Musculoskeletal:        General: No deformity. Normal range of motion.     Cervical back: Normal range of motion and neck supple.  Skin:    General: Skin is warm and dry.     Findings: No erythema or rash.  Neurological:     Mental Status: She is alert and oriented to person, place, and time. Mental status is at baseline.     Cranial Nerves: No cranial nerve deficit.     Coordination: Coordination normal.  Psychiatric:        Mood and Affect: Mood normal.   Breast examination deferred per patient.      LABORATORY DATA:  I have reviewed the data as listed Lab Results  Component Value Date   WBC 6.2 01/19/2019   HGB 13.1 01/19/2019   HCT 40.0 01/19/2019   MCV 92.2 01/19/2019   PLT 155 01/19/2019   Recent Labs    01/19/19 0845  NA 137  K 4.2  CL 103  CO2 26  GLUCOSE 149*  BUN 14  CREATININE 0.75  CALCIUM 9.3  GFRNONAA >60  GFRAA >60  PROT 6.6  ALBUMIN 3.9  AST 22  ALT 23  ALKPHOS 60  BILITOT 1.3*   Iron/TIBC/Ferritin/ %Sat No results found for: IRON, TIBC, FERRITIN, IRONPCTSAT   RADIOGRAPHIC STUDIES: I have personally reviewed the radiological images as listed and agreed with the findings in the report. MM DIAG BREAST TOMO BILATERAL  Result Date: 10/19/2019 CLINICAL DATA:  History of left breast cancer status post lumpectomy in 2019. EXAM: DIGITAL DIAGNOSTIC BILATERAL MAMMOGRAM WITH TOMO AND CAD COMPARISON:  Previous exam(s). ACR Breast Density Category b: There are scattered areas of fibroglandular density. FINDINGS: Stable lumpectomy changes are seen in the left  breast. No suspicious mass or malignant type microcalcifications identified in either breast. Mammographic images were processed with CAD. IMPRESSION: No evidence of malignancy in either breast. RECOMMENDATION: Bilateral diagnostic mammogram in 1 year is recommended. I  have discussed the findings and recommendations with the patient. If applicable, a reminder letter will be sent to the patient regarding the next appointment. BI-RADS CATEGORY  2: Benign. Electronically Signed   By: Lillia Mountain M.D.   On: 10/19/2019 13:25      ASSESSMENT & PLAN:  1. Aromatase inhibitor use   2. Malignant neoplasm of upper-outer quadrant of left breast in female, estrogen receptor positive ([place])   Cancer Staging Malignant neoplasm of upper-outer quadrant of left breast in female, estrogen receptor positive ([place]) Staging form: Breast, AJCC 8th Edition - Clinical: No stage assigned - Unsigned - Pathologic stage from 12/05/2017: Stage IA (pT1a, pN0, cM0, G1, ER+, PR+, HER2-) - Signed by Earlie Server, MD on 12/05/2017   # Breast Cancer, pT1a N0, stage 1A ER/PR +, HER2 negative.  Continue Arimidex 1 mg daily. Labs are reviewed and discussed with patient 10/19/2019, bilateral diagnostic mammogram showed no evidence of malignancy in either breast.  Plan repeat diagnostic mammogram in 1 year.  #Chronic aromatase inhibitor use, recommend patient to continue calcium and vitamin D supplementation. Previous DEXA showed normal bone density.  She is due for a repeat DEXA in oct 2021.  Will obtain.O We spent sufficient time to discuss many aspect of care, questions were answered to patient's satisfaction.  Orders Placed This Encounter  Procedures  . DG Bone Density    Standing Status:   Future    Standing Expiration Date:   10/20/2020    Order Specific Question:   Reason for Exam (SYMPTOM  OR DIAGNOSIS REQUIRED)    Answer:   breast cancer with Aromatase inhibitor use    Order Specific Question:   Preferred imaging location?     Answer:   [facility]    All questions were answered. The patient knows to call the clinic with any problems questions or concerns.  Return of visit: 6 months   Earlie Server, MD, PhD Hematology Oncology [facility] at [facility] Pager- [phone] 10/21/2019

## 2019-10-21 NOTE — Progress Notes (Signed)
Patient denies new problems/concerns today.   °

## 2019-10-23 ENCOUNTER — Other Ambulatory Visit: Payer: Self-pay | Admitting: Oncology

## 2019-12-30 ENCOUNTER — Ambulatory Visit
Admission: RE | Admit: 2019-12-30 | Discharge: 2019-12-30 | Disposition: A | Discharge status: Home / Self Care | Payer: BC Managed Care – PPO | Source: Ambulatory Visit | Attending: Oncology | Admitting: Oncology

## 2019-12-30 ENCOUNTER — Other Ambulatory Visit: Payer: Self-pay

## 2019-12-30 DIAGNOSIS — Z79811 Long term (current) use of aromatase inhibitors: Secondary | ICD-10-CM | POA: Insufficient documentation

## 2020-03-23 ENCOUNTER — Encounter: Payer: Self-pay | Admitting: Radiation Oncology

## 2020-03-23 ENCOUNTER — Ambulatory Visit
Admission: RE | Admit: 2020-03-23 | Discharge: 2020-03-23 | Disposition: A | Discharge status: Home / Self Care | Payer: BC Managed Care – PPO | Source: Ambulatory Visit | Attending: Radiation Oncology | Admitting: Radiation Oncology

## 2020-03-23 VITALS — BP 132/73 | HR 65 | Temp 97.2°F | Wt 173.0 lb

## 2020-03-23 DIAGNOSIS — C50412 Malignant neoplasm of upper-outer quadrant of left female breast: Secondary | ICD-10-CM

## 2020-03-23 NOTE — Progress Notes (Signed)
Radiation Oncology Follow up Note  Name: Gail Rivas   Date:   03/23/2020 MRN:  [id number] DOB: [birth year]    This 64 y.o. female presents to the clinic today for 2-year follow-up status post whole breast radiation to left breast for stage I ER/PR positive invasive mammary carcinoma.  REFERRING PROVIDER: Marguarite Arbour, MD  HPI: Patient is a 64 year old female now out 2 years having pleated whole breast radiation to her left breast for stage I ER/PR positive invasive mammary carcinoma. Seen today in routine follow-up she is doing well. She specifically denies breast tenderness cough or bone pain. She had mammograms back in July which I have reviewed were BI-RADS 2 benign. She is currently on Arimidex tolerant well without side effect.  COMPLICATIONS OF TREATMENT: none  FOLLOW UP COMPLIANCE: keeps appointments   PHYSICAL EXAM:  BP 132/73    Pulse 65    Temp (!) 97.2 F (36.2 C) (Tympanic)    Wt 173 lb (78.5 kg)    BMI 28.79 kg/m  Lungs are clear to A&P cardiac examination essentially unremarkable with regular rate and rhythm. No dominant mass or nodularity is noted in either breast in 2 positions examined. Incision is well-healed. No axillary or supraclavicular adenopathy is appreciated. Cosmetic result is excellent. Well-developed well-nourished patient in NAD. HEENT reveals PERLA, EOMI, discs not visualized.  Oral cavity is clear. No oral mucosal lesions are identified. Neck is clear without evidence of cervical or supraclavicular adenopathy. Lungs are clear to A&P. Cardiac examination is essentially unremarkable with regular rate and rhythm without murmur rub or thrill. Abdomen is benign with no organomegaly or masses noted. Motor sensory and DTR levels are equal and symmetric in the upper and lower extremities. Cranial nerves II through XII are grossly intact. Proprioception is intact. No peripheral adenopathy or edema is identified. No motor or sensory levels are noted. Crude  visual fields are within normal range.  RADIOLOGY RESULTS: Mammograms reviewed compatible with above-stated findings  PLAN: Present time patient is now 2 years out with no evidence of disease I am pleased with her overall progress. She continues on Arimidex without side effect. I have asked to see her back in 1 year for follow-up. Patient knows to call with any concerns.  I would like to take this opportunity to thank you for allowing me to participate in the care of your patient.Gail Miller, MD

## 2020-04-22 ENCOUNTER — Inpatient Hospital Stay: Payer: BC Managed Care – PPO | Attending: Oncology | Admitting: Oncology

## 2020-04-22 ENCOUNTER — Other Ambulatory Visit: Payer: Self-pay

## 2020-04-22 ENCOUNTER — Encounter: Payer: Self-pay | Admitting: Oncology

## 2020-04-22 VITALS — BP 115/75 | HR 70 | Temp 97.7°F | Resp 18 | Wt 179.0 lb

## 2020-04-22 DIAGNOSIS — Z79811 Long term (current) use of aromatase inhibitors: Secondary | ICD-10-CM

## 2020-04-22 DIAGNOSIS — Z170 Estrogen receptor positive status [ER+]: Secondary | ICD-10-CM | POA: Diagnosis not present

## 2020-04-22 DIAGNOSIS — M8580 Other specified disorders of bone density and structure, unspecified site: Secondary | ICD-10-CM | POA: Insufficient documentation

## 2020-04-22 DIAGNOSIS — C50412 Malignant neoplasm of upper-outer quadrant of left female breast: Secondary | ICD-10-CM | POA: Diagnosis not present

## 2020-04-22 DIAGNOSIS — Z79899 Other long term (current) drug therapy: Secondary | ICD-10-CM | POA: Insufficient documentation

## 2020-04-22 DIAGNOSIS — Z923 Personal history of irradiation: Secondary | ICD-10-CM | POA: Diagnosis not present

## 2020-04-22 MED ORDER — ANASTROZOLE 1 MG PO TABS: 1.0000 mg | ORAL_TABLET | Freq: Every day | ORAL | 1 refills | Status: DC

## 2020-04-22 NOTE — Progress Notes (Signed)
Hematology/Oncology Follow up  note [facility] Telephone:(336) [phone] [phone]   Patient Care Team: Idelle Crouch, MD as PCP - General (Internal Medicine)  REFERRING PROVIDER: Idelle Crouch, MD CHIEF COMPLAINTS  follow-up for management of breast cancer  HISTORY OF PRESENTING ILLNESS:  Gail Rivas is a  65 y.o.  female with PMH listed below who was referred to me for  breast cancer. Patient had screening mammogram and ultrasound on 10/15/2017 which showed left breast possible mass warrant further evaluation. Diagnostic breast mammogram on 7/31 2019 showed 6 mm spiculated mass slight distortion in the outer left breast.  Targeted ultrasound was performed showing no definite sonographic correlate is identified in the left breast.  Left axilla is negative for lymphadenopathy.   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  Grade 1, ER PR positive [>90%], HER-2 negative  Family history: Maternal grandmother was diagnosed with ovarian cancer in the 71s.  Father had colon cancer. Patient declined genetic testing.  # # 11/21/2017 She is s/p lumpectomy and sentinel lymph node biopsy.  Pathology showed 38mm invasive mammary carcinoma, grade 1, sentinel lymph node was negative.  stage 1A ER/PR +, HER2 negative.Her case was discussed at tumor board. Consensus was  No chemotherapy needed. adjuvant RT followed by anti estrogen treatment.  # # S/p Adjuvant RT finished 01/31/2018  # Started adjuvant antiestrogen with Letrozole on 11/21/ 2019. 04/04/2018 switch to anastrozole as letrozole is on national back order. # bilateral diagnostic mammogram done on 10/17/2018.  No mammographic evidence of malignancy. INTERVAL HISTORY [name] is a 65 y.o. female who has above history reviewed by me today presents for follow-up visit for management of pT1a N0, stage 1A  ER/PR +, HER2 negative Breast cancer Patient has been on Arimidex 1 mg daily.   Patient reports that she tolerates well.  Manageable side effects. She denies any breast concerns today.   Review of Systems  Constitutional: Negative for chills, fever, malaise/fatigue and weight loss.  HENT: Negative for nosebleeds and sore throat.   Eyes: Negative for double vision, photophobia and redness.  Respiratory: Negative for cough, shortness of breath and wheezing.   Cardiovascular: Negative for chest pain, palpitations, orthopnea and leg swelling.  Gastrointestinal: Negative for abdominal pain, blood in stool, nausea and vomiting.  Genitourinary: Negative for dysuria.  Musculoskeletal: Negative for back pain, myalgias and neck pain.  Skin: Negative for itching and rash.  Neurological: Negative for dizziness, tingling and tremors.  Endo/Heme/Allergies: Negative for environmental allergies. Does not bruise/bleed easily.  Psychiatric/Behavioral: Negative for depression and hallucinations. The patient is not nervous/anxious.     MEDICAL HISTORY:  Past Medical History:  Diagnosis Date  . Anxiety   . Breast cancer ([place]) 10/2017   invasive mammary carcinoma- Left  . Diabetes mellitus without complication ([place])   . Fibrocystic disease of breast   . GERD (gastroesophageal reflux disease)   . Headache   . Hyperlipidemia   . Hypertension   . Personal history of radiation therapy 2019   LEFT lumpectomy  . PONV (postoperative nausea and vomiting)    Colonoscopy  . Sleep apnea     SURGICAL HISTORY: Past Surgical History:  Procedure Laterality Date  . BREAST BIOPSY Left 10/30/2017   invasive mammary carcinoma  . BREAST CYST ASPIRATION Left   . BREAST LUMPECTOMY Left 11/21/2017   invasive mammary carcinoma   . COLONOSCOPY    . COLONOSCOPY WITH PROPOFOL  N/A 05/10/2017   Procedure: COLONOSCOPY WITH PROPOFOL;  Surgeon: Manya Silvas, MD;  Location: [facility] ENDOSCOPY;  Service: Endoscopy;   Laterality: N/A;  . PARTIAL MASTECTOMY WITH NEEDLE LOCALIZATION AND AXILLARY SENTINEL LYMPH NODE BX Left 11/21/2017   Procedure: PARTIAL MASTECTOMY WITH NEEDLE LOCALIZATION AND AXILLARY SENTINEL LYMPH NODE BX;  Surgeon: Benjamine Sprague, DO;  Location: [facility] ORS;  Service: General;  Laterality: Left;  . wisdom teeth removal      SOCIAL HISTORY: Social History   Socioeconomic History  . Marital status: Married    Spouse name: Not on file  . Number of children: Not on file  . Years of education: Not on file  . Highest education level: Not on file  Occupational History  . Not on file  Tobacco Use  . Smoking status: Never Smoker  . Smokeless tobacco: Never Used  Vaping Use  . Vaping Use: Never used  Substance and Sexual Activity  . Alcohol use: No  . Drug use: No  . Sexual activity: Yes  Other Topics Concern  . Not on file  Social History Narrative  . Not on file   Social Determinants of Health   Financial Resource Strain: Not on file  Food Insecurity: Not on file  Transportation Needs: Not on file  Physical Activity: Not on file  Stress: Not on file  Social Connections: Not on file  Intimate Partner Violence: Not on file    FAMILY HISTORY: Family History  Problem Relation Age of Onset  . Hypertension Mother   . Colon cancer Father   . Liver cancer Father   . Diabetes Father   . Ovarian cancer Maternal Grandmother   . Breast cancer Neg Hx     ALLERGIES:  is allergic to codeine, sulfa antibiotics, versed [midazolam], and penicillin v.  MEDICATIONS:  Current Outpatient Medications  Medication Sig Dispense Refill  . calcium elemental as carbonate (BARIATRIC TUMS ULTRA) 400 MG chewable tablet Chew by mouth.    . cetirizine (ZYRTEC) 10 MG tablet Take 10 mg by mouth daily.    . Cholecalciferol 25 MCG (1000 UT) tablet Take 1,000 Units by mouth daily.    . clotrimazole-betamethasone (LOTRISONE) cream Apply 1 application topically daily as needed (eczema).     Marland Kitchen glimepiride  (AMARYL) 4 MG tablet Take 4 mg by mouth 2 (two) times daily.    . metFORMIN (GLUCOPHAGE) 1000 MG tablet Take 1,000 mg by mouth 2 (two) times daily with a meal.    . metoprolol (TOPROL-XL) 200 MG 24 hr tablet Take 200 mg by mouth daily.    . pioglitazone (ACTOS) 45 MG tablet Take 45 mg by mouth daily.    . rosuvastatin (CRESTOR) 10 MG tablet Take by mouth.    . sitaGLIPtin (JANUVIA) 100 MG tablet Take 100 mg by mouth daily.    Marland Kitchen telmisartan-hydrochlorothiazide (MICARDIS HCT) 80-25 MG tablet Take 1 tablet by mouth daily. Take 1 tablet by mouth daily     . anastrozole (ARIMIDEX) 1 MG tablet Take 1 tablet (1 mg total) by mouth daily. 90 tablet 1   No current facility-administered medications for this visit.     PHYSICAL EXAMINATION: ECOG PERFORMANCE STATUS: 0 - Asymptomatic Vitals:   04/22/20 1316  BP: 115/75  Pulse: 70  Resp: 18  Temp: 97.7 F (36.5 C)   Filed Weights   04/22/20 1316  Weight: 179 lb (81.2 kg)    Physical Exam Constitutional:      General: She is not in acute distress. HENT:  Head: Normocephalic and atraumatic.  Eyes:     General: No scleral icterus. Cardiovascular:     Rate and Rhythm: Normal rate and regular rhythm.     Heart sounds: Normal heart sounds.  Pulmonary:     Effort: Pulmonary effort is normal. No respiratory distress.     Breath sounds: No wheezing.  Abdominal:     General: Bowel sounds are normal. There is no distension.     Palpations: Abdomen is soft.  Musculoskeletal:        General: No deformity. Normal range of motion.     Cervical back: Normal range of motion and neck supple.  Skin:    General: Skin is warm and dry.     Findings: No erythema or rash.  Neurological:     Mental Status: She is alert and oriented to person, place, and time. Mental status is at baseline.     Cranial Nerves: No cranial nerve deficit.     Coordination: Coordination normal.  Psychiatric:        Mood and Affect: Mood normal.   Breast exam was  performed in seated and lying down position. Patient is status post left lumpectomy with a well-healed surgical scar, local scar/thickening tissue.  No evidence of any palpable masses bilaterally. No evidence of bilateral axillary adenopathy.     LABORATORY DATA:  I have reviewed the data as listed Lab Results  Component Value Date   WBC 6.2 01/19/2019   HGB 13.1 01/19/2019   HCT 40.0 01/19/2019   MCV 92.2 01/19/2019   PLT 155 01/19/2019   No results for input(s): NA, K, CL, CO2, GLUCOSE, BUN, CREATININE, CALCIUM, GFRNONAA, GFRAA, PROT, ALBUMIN, AST, ALT, ALKPHOS, BILITOT, BILIDIR, IBILI in the last 8760 hours. Iron/TIBC/Ferritin/ %Sat No results found for: IRON, TIBC, FERRITIN, IRONPCTSAT   RADIOGRAPHIC STUDIES: I have personally reviewed the radiological images as listed and agreed with the findings in the report. No results found.    ASSESSMENT & PLAN:  1. Aromatase inhibitor use   2. Malignant neoplasm of upper-outer quadrant of left breast in female, estrogen receptor positive (HCC)   Cancer Staging Malignant neoplasm of upper-outer quadrant of left breast in female, estrogen receptor positive (HCC) Staging form: Breast, AJCC 8th Edition - Clinical: No stage assigned - Unsigned - Pathologic stage from 12/05/2017: Stage IA (pT1a, pN0, cM0, G1, ER+, PR+, HER2-) - Signed by Rickard Patience, MD on 12/05/2017   # Breast Cancer, pT1a N0, stage 1A ER/PR +, HER2 negative.  Patient has not had blood work done prior to current visit.  She has a plan to have blood work done at her PCPs office. Continue Arimidex 1 mg daily.  She tolerates well. Obtain mammogram in in July 2022.  #Chronic aromatase inhiitor use, . DEXA October 2021 showed osteopenia with 10-year probability of fracture 7.3%, major osteoporotic fracture 0.4%.  Recommend patient to continue calcium 1200 mg daily and vitamin D supplementation.  We spent sufficient time to discuss many aspect of care, questions were answered  to patient's satisfaction.  Orders Placed This Encounter  Procedures  . MM 3D SCREEN BREAST BILATERAL    Standing Status:   Future    Standing Expiration Date:   04/22/2021    Order Specific Question:   Reason for Exam (SYMPTOM  OR DIAGNOSIS REQUIRED)    Answer:   hx breast CA    Order Specific Question:   Preferred imaging location?    Answer:   [facility]    All questions  were answered. The patient knows to call the clinic with any problems questions or concerns.  Return of visit: 6 months   Earlie Server, MD, PhD Hematology Oncology [facility] at [facility] Pager- [phone] 04/22/2020

## 2020-04-22 NOTE — Progress Notes (Signed)
Pt here for follow up. No new concerns voiced.   

## 2020-10-19 ENCOUNTER — Ambulatory Visit
Admission: RE | Admit: 2020-10-19 | Discharge: 2020-10-19 | Disposition: A | Discharge status: Home / Self Care | Payer: BC Managed Care – PPO | Source: Ambulatory Visit | Attending: Oncology | Admitting: Oncology

## 2020-10-19 ENCOUNTER — Other Ambulatory Visit: Payer: Self-pay

## 2020-10-19 DIAGNOSIS — Z1231 Encounter for screening mammogram for malignant neoplasm of breast: Secondary | ICD-10-CM | POA: Diagnosis not present

## 2020-10-19 DIAGNOSIS — C50412 Malignant neoplasm of upper-outer quadrant of left female breast: Secondary | ICD-10-CM | POA: Diagnosis present

## 2020-10-19 DIAGNOSIS — Z170 Estrogen receptor positive status [ER+]: Secondary | ICD-10-CM | POA: Insufficient documentation

## 2020-10-21 ENCOUNTER — Inpatient Hospital Stay: Payer: BC Managed Care – PPO | Admitting: Oncology

## 2020-10-25 ENCOUNTER — Encounter: Payer: Self-pay | Admitting: Oncology

## 2020-10-25 ENCOUNTER — Other Ambulatory Visit: Payer: Self-pay

## 2020-10-25 ENCOUNTER — Inpatient Hospital Stay: Payer: BC Managed Care – PPO | Attending: Oncology | Admitting: Oncology

## 2020-10-25 VITALS — BP 121/67 | HR 73 | Temp 96.9°F | Resp 18 | Wt 180.0 lb

## 2020-10-25 DIAGNOSIS — Z8041 Family history of malignant neoplasm of ovary: Secondary | ICD-10-CM | POA: Diagnosis not present

## 2020-10-25 DIAGNOSIS — Z170 Estrogen receptor positive status [ER+]: Secondary | ICD-10-CM | POA: Insufficient documentation

## 2020-10-25 DIAGNOSIS — Z79811 Long term (current) use of aromatase inhibitors: Secondary | ICD-10-CM | POA: Insufficient documentation

## 2020-10-25 DIAGNOSIS — C50412 Malignant neoplasm of upper-outer quadrant of left female breast: Secondary | ICD-10-CM | POA: Insufficient documentation

## 2020-10-25 MED ORDER — ANASTROZOLE 1 MG PO TABS: 1.0000 mg | ORAL_TABLET | Freq: Every day | ORAL | 1 refills | Status: DC

## 2020-10-25 NOTE — Progress Notes (Signed)
Hematology/Oncology Follow up note [facility] Telephone:([phone] Fax:(336) [phone]   Patient Care Team: Idelle Crouch, MD as PCP - General (Internal Medicine)  REFERRING PROVIDER: Idelle Crouch, MD CHIEF COMPLAINTS  follow-up for management of breast cancer  HISTORY OF PRESENTING ILLNESS:  Gail Rivas is a  64 y.o.  female with PMH listed below who was referred to me for  breast cancer. Patient had screening mammogram and ultrasound on 10/15/2017 which showed left breast possible mass warrant further evaluation. Diagnostic breast mammogram on 7/31 2019 showed 6 mm spiculated mass slight distortion in the outer left breast.  Targeted ultrasound was performed showing no definite sonographic correlate is identified in the left breast.  Left axilla is negative for lymphadenopathy.   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  Grade 1, ER PR positive [>90%], HER-2 negative  Family history: Maternal grandmother was diagnosed with ovarian cancer in the 21s.  Father had colon cancer. Patient declined genetic testing.  # # 11/21/2017 She is s/p lumpectomy and sentinel lymph node biopsy.  Pathology showed 75mm invasive mammary carcinoma, grade 1, sentinel lymph node was negative.  stage 1A ER/PR +, HER2 negative.Her case was discussed at tumor board. Consensus was  No chemotherapy needed. adjuvant RT followed by anti estrogen treatment.  # # S/p Adjuvant RT finished 01/31/2018  # Started adjuvant antiestrogen with Letrozole on 11/21/ 2019. 04/04/2018 switch to anastrozole as letrozole is on national back order. # bilateral diagnostic mammogram done on 10/17/2018.  No mammographic evidence of malignancy. INTERVAL HISTORY Gail Rivas is a 65 y.o. female who has above history reviewed by me today presents for follow-up visit for management of pT1a N0, stage 1A ER/PR  +, HER2 negative Breast cancer Patient has been on Arimidex 1 mg daily.  Manageable side effects. Reports intermittent left breast soreness at the site of lumpectomy.  Otherwise no new concerns.   Review of Systems  Constitutional:  Negative for chills, fever, malaise/fatigue and weight loss.  HENT:  Negative for nosebleeds and sore throat.   Eyes:  Negative for double vision, photophobia and redness.  Respiratory:  Negative for cough, shortness of breath and wheezing.   Cardiovascular:  Negative for chest pain, palpitations, orthopnea and leg swelling.  Gastrointestinal:  Negative for abdominal pain, blood in stool, nausea and vomiting.  Genitourinary:  Negative for dysuria.  Musculoskeletal:  Negative for back pain, myalgias and neck pain.  Skin:  Negative for itching and rash.  Neurological:  Negative for dizziness, tingling and tremors.  Endo/Heme/Allergies:  Negative for environmental allergies. Does not bruise/bleed easily.  Psychiatric/Behavioral:  Negative for depression and hallucinations. The patient is not nervous/anxious.    MEDICAL HISTORY:  Past Medical History:  Diagnosis Date   Anxiety    Breast cancer ([place]) 10/2017   invasive mammary carcinoma- Left   Diabetes mellitus without complication (HCC)    Fibrocystic disease of breast    GERD (gastroesophageal reflux disease)    Headache    Hyperlipidemia    Hypertension    Personal history of radiation therapy 2019   LEFT lumpectomy   PONV (postoperative nausea and vomiting)    Colonoscopy   Sleep apnea     SURGICAL HISTORY: Past Surgical History:  Procedure Laterality Date   BREAST BIOPSY Left 10/30/2017   invasive mammary carcinoma   BREAST CYST ASPIRATION Left    BREAST LUMPECTOMY Left 11/21/2017   invasive mammary carcinoma  COLONOSCOPY     COLONOSCOPY WITH PROPOFOL N/A 05/10/2017   Procedure: COLONOSCOPY WITH PROPOFOL;  Surgeon: Manya Silvas, MD;  Location: [facility] ENDOSCOPY;  Service: Endoscopy;   Laterality: N/A;   PARTIAL MASTECTOMY WITH NEEDLE LOCALIZATION AND AXILLARY SENTINEL LYMPH NODE BX Left 11/21/2017   Procedure: PARTIAL MASTECTOMY WITH NEEDLE LOCALIZATION AND AXILLARY SENTINEL LYMPH NODE BX;  Surgeon: Benjamine Sprague, DO;  Location: [facility] ORS;  Service: General;  Laterality: Left;   wisdom teeth removal      SOCIAL HISTORY: Social History   Socioeconomic History   Marital status: Married    Spouse name: Not on file   Number of children: Not on file   Years of education: Not on file   Highest education level: Not on file  Occupational History   Not on file  Tobacco Use   Smoking status: Never   Smokeless tobacco: Never  Vaping Use   Vaping Use: Never used  Substance and Sexual Activity   Alcohol use: No   Drug use: No   Sexual activity: Yes  Other Topics Concern   Not on file  Social History Narrative   Not on file   Social Determinants of Health   Financial Resource Strain: Not on file  Food Insecurity: Not on file  Transportation Needs: Not on file  Physical Activity: Not on file  Stress: Not on file  Social Connections: Not on file  Intimate Partner Violence: Not on file    FAMILY HISTORY: Family History  Problem Relation Age of Onset   Hypertension Mother    Colon cancer Father    Liver cancer Father    Diabetes Father    Ovarian cancer Maternal Grandmother    Breast cancer Neg Hx     ALLERGIES:  is allergic to codeine, sulfa antibiotics, versed [midazolam], and penicillin v.  MEDICATIONS:  Current Outpatient Medications  Medication Sig Dispense Refill   calcium elemental as carbonate (BARIATRIC TUMS ULTRA) 400 MG chewable tablet Chew by mouth.     cetirizine (ZYRTEC) 10 MG tablet Take 10 mg by mouth daily.     Cholecalciferol 25 MCG (1000 UT) tablet Take 1,000 Units by mouth daily.     clotrimazole-betamethasone (LOTRISONE) cream Apply 1 application topically daily as needed (eczema).      glimepiride (AMARYL) 4 MG tablet Take 4 mg by  mouth 2 (two) times daily.     metFORMIN (GLUCOPHAGE) 1000 MG tablet Take 1,000 mg by mouth 2 (two) times daily with a meal.     metoprolol (TOPROL-XL) 200 MG 24 hr tablet Take 200 mg by mouth daily.     pioglitazone (ACTOS) 45 MG tablet Take 45 mg by mouth daily.     rosuvastatin (CRESTOR) 10 MG tablet Take by mouth.     sitaGLIPtin (JANUVIA) 100 MG tablet Take 100 mg by mouth daily.     telmisartan-hydrochlorothiazide (MICARDIS HCT) 80-25 MG tablet Take 1 tablet by mouth daily. Take 1 tablet by mouth daily      anastrozole (ARIMIDEX) 1 MG tablet Take 1 tablet (1 mg total) by mouth daily. 90 tablet 1   No current facility-administered medications for this visit.     PHYSICAL EXAMINATION: ECOG PERFORMANCE STATUS: 0 - Asymptomatic Vitals:   10/25/20 1434  BP: 121/67  Pulse: 73  Resp: 18  Temp: (!) 96.9 F (36.1 C)  SpO2: 96%   Filed Weights   10/25/20 1434  Weight: 180 lb (81.6 kg)    Physical Exam Constitutional:  General: She is not in acute distress. HENT:     Head: Normocephalic and atraumatic.  Eyes:     General: No scleral icterus. Cardiovascular:     Rate and Rhythm: Normal rate and regular rhythm.     Heart sounds: Normal heart sounds.  Pulmonary:     Effort: Pulmonary effort is normal. No respiratory distress.     Breath sounds: No wheezing.  Abdominal:     General: Bowel sounds are normal. There is no distension.     Palpations: Abdomen is soft.  Musculoskeletal:        General: No deformity. Normal range of motion.     Cervical back: Normal range of motion and neck supple.  Skin:    General: Skin is warm and dry.     Findings: No erythema or rash.  Neurological:     Mental Status: She is alert and oriented to person, place, and time. Mental status is at baseline.     Cranial Nerves: No cranial nerve deficit.     Coordination: Coordination normal.  Psychiatric:        Mood and Affect: Mood normal.  Breast exam was performed in seated and lying  down position. Patient is status post left lumpectomy with a well-healed surgical scar, with chronic local scar/thickening tissue at lumpectomy site.  No evidence of any palpable masses bilaterally. No evidence of bilateral axillary adenopathy.     LABORATORY DATA:  I have reviewed the data as listed Lab Results  Component Value Date   WBC 6.2 01/19/2019   HGB 13.1 01/19/2019   HCT 40.0 01/19/2019   MCV 92.2 01/19/2019   PLT 155 01/19/2019   No results for input(s): NA, K, CL, CO2, GLUCOSE, BUN, CREATININE, CALCIUM, GFRNONAA, GFRAA, PROT, ALBUMIN, AST, ALT, ALKPHOS, BILITOT, BILIDIR, IBILI in the last 8760 hours. Iron/TIBC/Ferritin/ %Sat No results found for: IRON, TIBC, FERRITIN, IRONPCTSAT   RADIOGRAPHIC STUDIES: I have personally reviewed the radiological images as listed and agreed with the findings in the report. MM 3D SCREEN BREAST BILATERAL  Result Date: 10/20/2020 CLINICAL DATA:  Screening. Left lumpectomy 2019. EXAM: DIGITAL SCREENING BILATERAL MAMMOGRAM WITH TOMOSYNTHESIS AND CAD TECHNIQUE: Bilateral screening digital craniocaudal and mediolateral oblique mammograms were obtained. Bilateral screening digital breast tomosynthesis was performed. The images were evaluated with computer-aided detection. COMPARISON:  Previous exam(s). ACR Breast Density Category b: There are scattered areas of fibroglandular density. FINDINGS: There are no findings suspicious for malignancy. IMPRESSION: No mammographic evidence of malignancy. A result letter of this screening mammogram will be mailed directly to the patient. RECOMMENDATION: Screening mammogram in one year. (Code:SM-B-01Y) BI-RADS CATEGORY  1: Negative. Electronically Signed   By: Everlean Alstrom M.D.   On: 10/20/2020 11:18     ASSESSMENT & PLAN:  1. Aromatase inhibitor use   2. Malignant neoplasm of upper-outer quadrant of left breast in female, estrogen receptor positive ([place])   3. Family history of ovarian cancer   Cancer  Staging Malignant neoplasm of upper-outer quadrant of left breast in female, estrogen receptor positive ([place]) Staging form: Breast, AJCC 8th Edition - Clinical: No stage assigned - Unsigned - Pathologic stage from 12/05/2017: Stage IA (pT1a, pN0, cM0, G1, ER+, PR+, HER2-) - Signed by Earlie Server, MD on 12/05/2017   # Breast Cancer, pT1a N0, stage 1A ER/PR +, HER2 negative.  Patient has not had blood work done prior to current visit.  She has a plan to have blood work done at her PCPs office. Continue Arimidex 1 mg  daily 10/19/2020, bilateral screening mammogram is negative for mammographic evidence of malignancy.  Left  #Chronic aromatase inhiitor use, . DEXA October 2021 showed osteopenia with 10-year probability of fracture 7.3%, major osteoporotic fracture 0.4%.  Recommend patient to continue calcium supplementation and vitamin D.  Family history of cancer, refer to genetic counselor. We spent sufficient time to discuss many aspect of care, questions were answered to patient's satisfaction.  Orders Placed This Encounter  Procedures   CBC with Differential/Platelet    Standing Status:   Future    Standing Expiration Date:   10/25/2021   Comprehensive metabolic panel    Standing Status:   Future    Standing Expiration Date:   10/25/2021    All questions were answered. The patient knows to call the clinic with any problems questions or concerns.  Return of visit: 6 months   Earlie Server, MD, PhD Hematology Oncology [facility] at [facility] Pager- [phone] 10/25/2020

## 2020-10-25 NOTE — Progress Notes (Signed)
Pt in for follow up and results of mammogram.  Denies any concerns today.

## 2020-10-26 ENCOUNTER — Telehealth: Payer: Self-pay

## 2020-10-26 NOTE — Telephone Encounter (Signed)
Patient does not wish to proceed with genetic counselor.

## 2020-10-26 NOTE — Telephone Encounter (Signed)
-----   Message from Earlie Server, MD sent at 10/25/2020 10:16 PM EDT ----- Please ask her if she has been referred to genetic counselor yet but if if not, let her know that I recommend her to consider genetic testing given her history of breast cancer and family history of cancer.  If she agrees please refer to genetic counselor.

## 2021-01-10 ENCOUNTER — Other Ambulatory Visit: Payer: Self-pay | Admitting: Oncology

## 2021-03-23 ENCOUNTER — Other Ambulatory Visit: Payer: Self-pay

## 2021-03-23 ENCOUNTER — Ambulatory Visit
Admission: RE | Admit: 2021-03-23 | Discharge: 2021-03-23 | Disposition: A | Discharge status: Home / Self Care | Payer: BC Managed Care – PPO | Source: Ambulatory Visit | Attending: Radiation Oncology | Admitting: Radiation Oncology

## 2021-03-23 ENCOUNTER — Encounter: Payer: Self-pay | Admitting: Radiation Oncology

## 2021-03-23 VITALS — BP 113/72 | HR 60 | Temp 97.0°F | Wt 184.7 lb

## 2021-03-23 DIAGNOSIS — C50412 Malignant neoplasm of upper-outer quadrant of left female breast: Secondary | ICD-10-CM | POA: Diagnosis present

## 2021-03-23 DIAGNOSIS — Z79811 Long term (current) use of aromatase inhibitors: Secondary | ICD-10-CM | POA: Insufficient documentation

## 2021-03-23 DIAGNOSIS — Z170 Estrogen receptor positive status [ER+]: Secondary | ICD-10-CM | POA: Diagnosis not present

## 2021-03-23 DIAGNOSIS — Z923 Personal history of irradiation: Secondary | ICD-10-CM | POA: Diagnosis not present

## 2021-03-23 NOTE — Progress Notes (Signed)
Radiation Oncology Follow up Note  Name: Gail Rivas   Date:   03/23/2021 MRN:  [id number] DOB: 09-19-1955    This 65 y.o. female presents to the clinic today for 3-year follow-up status post whole breast radiation to her left breast for stage I ER/PR positive invasive mammary carcinoma.  REFERRING PROVIDER: Idelle Crouch, MD  HPI: Patient is a 65 year old female now out 3 years having completed whole breast radiation to her left breast for stage I ER/PR positive base of mammary carcinoma.  Seen today in routine follow-up she is doing well specifically denies breast tenderness cough or bone pain.Marland Kitchen  Her most recent mammogram back in July was BI-RADS Category 1 negative.  She is currently on Arimidex tolerant at well without side effect.  COMPLICATIONS OF TREATMENT: none  FOLLOW UP COMPLIANCE: keeps appointments   PHYSICAL EXAM:  BP 113/72    Pulse 60    Temp (!) 97 F (36.1 C) (Tympanic)    Wt 184 lb 11.2 oz (83.8 kg)    BMI 30.74 kg/m  Lungs are clear to A&P cardiac examination essentially unremarkable with regular rate and rhythm. No dominant mass or nodularity is noted in either breast in 2 positions examined. Incision is well-healed. No axillary or supraclavicular adenopathy is appreciated. Cosmetic result is excellent.  Well-developed well-nourished patient in NAD. HEENT reveals PERLA, EOMI, discs not visualized.  Oral cavity is clear. No oral mucosal lesions are identified. Neck is clear without evidence of cervical or supraclavicular adenopathy. Lungs are clear to A&P. Cardiac examination is essentially unremarkable with regular rate and rhythm without murmur rub or thrill. Abdomen is benign with no organomegaly or masses noted. Motor sensory and DTR levels are equal and symmetric in the upper and lower extremities. Cranial nerves II through XII are grossly intact. Proprioception is intact. No peripheral adenopathy or edema is identified. No motor or sensory levels are noted.  Crude visual fields are within normal range.  RADIOLOGY RESULTS: Mammograms reviewed compatible with above-stated findings  PLAN: Present time patient is doing well now out over 3 years with no evidence of disease.  I am going to discontinue follow-up care.  She continues close follow-up care with Dr. Doy Hutching and medical oncology.  Patient knows to call at anytime with any concerns.  I would like to take this opportunity to thank you for allowing me to participate in the care of your patient.Noreene Filbert, MD

## 2021-05-01 ENCOUNTER — Encounter: Payer: Self-pay | Admitting: Oncology

## 2021-05-01 ENCOUNTER — Other Ambulatory Visit: Payer: Self-pay

## 2021-05-01 ENCOUNTER — Inpatient Hospital Stay: Payer: BC Managed Care – PPO | Attending: Oncology | Admitting: Oncology

## 2021-05-01 ENCOUNTER — Other Ambulatory Visit: Payer: BC Managed Care – PPO

## 2021-05-01 VITALS — BP 120/62 | HR 65 | Temp 98.7°F | Resp 20 | Wt 187.7 lb

## 2021-05-01 DIAGNOSIS — Z8041 Family history of malignant neoplasm of ovary: Secondary | ICD-10-CM | POA: Diagnosis not present

## 2021-05-01 DIAGNOSIS — Z170 Estrogen receptor positive status [ER+]: Secondary | ICD-10-CM

## 2021-05-01 DIAGNOSIS — M8580 Other specified disorders of bone density and structure, unspecified site: Secondary | ICD-10-CM | POA: Insufficient documentation

## 2021-05-01 DIAGNOSIS — Z79811 Long term (current) use of aromatase inhibitors: Secondary | ICD-10-CM

## 2021-05-01 DIAGNOSIS — Z79899 Other long term (current) drug therapy: Secondary | ICD-10-CM | POA: Insufficient documentation

## 2021-05-01 DIAGNOSIS — C50412 Malignant neoplasm of upper-outer quadrant of left female breast: Secondary | ICD-10-CM | POA: Diagnosis not present

## 2021-05-01 MED ORDER — ANASTROZOLE 1 MG PO TABS: 1.0000 mg | ORAL_TABLET | Freq: Every day | ORAL | 1 refills | Status: DC

## 2021-05-01 NOTE — Progress Notes (Signed)
Patient states no concerns at the moment. 

## 2021-05-01 NOTE — Progress Notes (Signed)
Hematology/Oncology Progress note [phone] [phone]      Patient Care Team: Idelle Crouch, MD as PCP - General (Internal Medicine)  REFERRING PROVIDER: Idelle Crouch, MD CHIEF COMPLAINTS  follow-up for management of breast cancer  HISTORY OF PRESENTING ILLNESS:  Gail Rivas is a  66 y.o.  female with PMH listed below who was referred to me for  breast cancer. Patient had screening mammogram and ultrasound on 10/15/2017 which showed left breast possible mass warrant further evaluation. Diagnostic breast mammogram on 7/31 2019 showed 6 mm spiculated mass slight distortion in the outer left breast.  Targeted ultrasound was performed showing no definite sonographic correlate is identified in the left breast.  Left axilla is negative for lymphadenopathy.   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  Grade 1, ER PR positive [>90%], HER-2 negative  Family history: Maternal grandmother was diagnosed with ovarian cancer in the 18s.  Father had colon cancer. Patient declined genetic testing.  # # 11/21/2017 She is s/p lumpectomy and sentinel lymph node biopsy.  Pathology showed 35m invasive mammary carcinoma, grade 1, sentinel lymph node was negative.  stage 1A ER/PR +, HER2 negative.Her case was discussed at tumor board. Consensus was  No chemotherapy needed. adjuvant RT followed by anti estrogen treatment.  # # S/p Adjuvant RT finished 01/31/2018  # Started adjuvant antiestrogen with Letrozole on 11/21/ 2019. 04/04/2018 switch to anastrozole as letrozole is on national back order. # bilateral diagnostic mammogram done on 10/17/2018.  No mammographic evidence of malignancy. INTERVAL HISTORY Gail AMPAROis a 66y.o. female who has above history reviewed by me today presents for follow-up visit for management of pT1a N0, stage 1A ER/PR +, HER2 negative Breast  cancer Patient has been on Arimidex 1 mg daily.  Patient reports that she has been feeling well.  No significant side effects. She has no new complaints or breast concerns today.    Review of Systems  Constitutional:  Negative for chills, fever, malaise/fatigue and weight loss.  HENT:  Negative for nosebleeds and sore throat.   Eyes:  Negative for double vision, photophobia and redness.  Respiratory:  Negative for cough, shortness of breath and wheezing.   Cardiovascular:  Negative for chest pain, palpitations, orthopnea and leg swelling.  Gastrointestinal:  Negative for abdominal pain, blood in stool, nausea and vomiting.  Genitourinary:  Negative for dysuria.  Musculoskeletal:  Negative for back pain, myalgias and neck pain.  Skin:  Negative for itching and rash.  Neurological:  Negative for dizziness, tingling and tremors.  Endo/Heme/Allergies:  Negative for environmental allergies. Does not bruise/bleed easily.  Psychiatric/Behavioral:  Negative for depression and hallucinations. The patient is not nervous/anxious.    MEDICAL HISTORY:  Past Medical History:  Diagnosis Date   Anxiety    Breast cancer ([place] 10/2017   invasive mammary carcinoma- Left   Diabetes mellitus without complication (HCC)    Fibrocystic disease of breast    GERD (gastroesophageal reflux disease)    Headache    Hyperlipidemia    Hypertension    Personal history of radiation therapy 2019   LEFT lumpectomy   PONV (postoperative nausea and vomiting)    Colonoscopy   Sleep apnea     SURGICAL HISTORY: Past Surgical History:  Procedure Laterality Date   BREAST BIOPSY Left 10/30/2017   invasive mammary carcinoma   BREAST CYST ASPIRATION Left    BREAST LUMPECTOMY Left 11/21/2017  invasive mammary carcinoma    COLONOSCOPY     COLONOSCOPY WITH PROPOFOL N/A 05/10/2017   Procedure: COLONOSCOPY WITH PROPOFOL;  Surgeon: Manya Silvas, MD;  Location: [facility] ENDOSCOPY;  Service: Endoscopy;  Laterality: N/A;    PARTIAL MASTECTOMY WITH NEEDLE LOCALIZATION AND AXILLARY SENTINEL LYMPH NODE BX Left 11/21/2017   Procedure: PARTIAL MASTECTOMY WITH NEEDLE LOCALIZATION AND AXILLARY SENTINEL LYMPH NODE BX;  Surgeon: Benjamine Sprague, DO;  Location: [facility] ORS;  Service: General;  Laterality: Left;   wisdom teeth removal      SOCIAL HISTORY: Social History   Socioeconomic History   Marital status: Married    Spouse name: Not on file   Number of children: Not on file   Years of education: Not on file   Highest education level: Not on file  Occupational History   Not on file  Tobacco Use   Smoking status: Never   Smokeless tobacco: Never  Vaping Use   Vaping Use: Never used  Substance and Sexual Activity   Alcohol use: No   Drug use: No   Sexual activity: Yes  Other Topics Concern   Not on file  Social History Narrative   Not on file   Social Determinants of Health   Financial Resource Strain: Not on file  Food Insecurity: Not on file  Transportation Needs: Not on file  Physical Activity: Not on file  Stress: Not on file  Social Connections: Not on file  Intimate Partner Violence: Not on file    FAMILY HISTORY: Family History  Problem Relation Age of Onset   Hypertension Mother    Colon cancer Father    Liver cancer Father    Diabetes Father    Ovarian cancer Maternal Grandmother    Breast cancer Neg Hx     ALLERGIES:  is allergic to codeine, sulfa antibiotics, versed [midazolam], and penicillin v.  MEDICATIONS:  Current Outpatient Medications  Medication Sig Dispense Refill   anastrozole (ARIMIDEX) 1 MG tablet Take 1 tablet by mouth once daily 90 tablet 0   calcium elemental as carbonate (BARIATRIC TUMS ULTRA) 400 MG chewable tablet Chew by mouth.     cetirizine (ZYRTEC) 10 MG tablet Take 10 mg by mouth daily.     Cholecalciferol 25 MCG (1000 UT) tablet Take 1,000 Units by mouth daily.     clotrimazole-betamethasone (LOTRISONE) cream Apply 1 application topically daily as  needed (eczema).      glimepiride (AMARYL) 4 MG tablet Take 4 mg by mouth 2 (two) times daily.     metFORMIN (GLUCOPHAGE) 1000 MG tablet Take 1,000 mg by mouth 2 (two) times daily with a meal.     metoprolol (TOPROL-XL) 200 MG 24 hr tablet Take 200 mg by mouth daily.     pioglitazone (ACTOS) 45 MG tablet Take 45 mg by mouth daily.     rosuvastatin (CRESTOR) 10 MG tablet Take by mouth.     sitaGLIPtin (JANUVIA) 100 MG tablet Take 100 mg by mouth daily.     telmisartan-hydrochlorothiazide (MICARDIS HCT) 80-25 MG tablet Take 1 tablet by mouth daily. Take 1 tablet by mouth daily      No current facility-administered medications for this visit.     PHYSICAL EXAMINATION: ECOG PERFORMANCE STATUS: 0 - Asymptomatic Vitals:   05/01/21 1255  BP: 120/62  Pulse: 65  Resp: 20  Temp: 98.7 F (37.1 C)  SpO2: 100%   Filed Weights   05/01/21 1255  Weight: 187 lb 11.2 oz (85.1 kg)    Physical  Exam Constitutional:      General: She is not in acute distress. HENT:     Head: Normocephalic and atraumatic.  Eyes:     General: No scleral icterus. Cardiovascular:     Rate and Rhythm: Normal rate and regular rhythm.     Heart sounds: Normal heart sounds.  Pulmonary:     Effort: Pulmonary effort is normal. No respiratory distress.     Breath sounds: No wheezing.  Abdominal:     General: Bowel sounds are normal. There is no distension.     Palpations: Abdomen is soft.  Musculoskeletal:        General: No deformity. Normal range of motion.     Cervical back: Normal range of motion and neck supple.  Skin:    General: Skin is warm and dry.     Findings: No erythema or rash.  Neurological:     Mental Status: She is alert and oriented to person, place, and time. Mental status is at baseline.     Cranial Nerves: No cranial nerve deficit.     Coordination: Coordination normal.  Psychiatric:        Mood and Affect: Mood normal.  Breast exam was performed in seated and lying down  position. Patient is status post left lumpectomy with a well-healed surgical scar, with chronic local scar/thickening tissue at lumpectomy site.  No evidence of any palpable masses bilaterally. No evidence of bilateral axillary adenopathy.     LABORATORY DATA:  I have reviewed the data as listed Lab Results  Component Value Date   WBC 6.2 01/19/2019   HGB 13.1 01/19/2019   HCT 40.0 01/19/2019   MCV 92.2 01/19/2019   PLT 155 01/19/2019   No results for input(s): NA, K, CL, CO2, GLUCOSE, BUN, CREATININE, CALCIUM, GFRNONAA, GFRAA, PROT, ALBUMIN, AST, ALT, ALKPHOS, BILITOT, BILIDIR, IBILI in the last 8760 hours. Iron/TIBC/Ferritin/ %Sat No results found for: IRON, TIBC, FERRITIN, IRONPCTSAT   RADIOGRAPHIC STUDIES: I have personally reviewed the radiological images as listed and agreed with the findings in the report. No results found.    ASSESSMENT & PLAN:  1. Malignant neoplasm of upper-outer quadrant of left breast in female, estrogen receptor positive ([place])   2. Aromatase inhibitor use   3. Family history of ovarian cancer    Cancer Staging  Malignant neoplasm of upper-outer quadrant of left breast in female, estrogen receptor positive ([place]) Staging form: Breast, AJCC 8th Edition - Clinical: No stage assigned - Unsigned - Pathologic stage from 12/05/2017: Stage IA (pT1a, pN0, cM0, G1, ER+, PR+, HER2-) - Signed by Earlie Server, MD on 12/05/2017   # Breast Cancer, pT1a N0, stage 1A ER/PR +, HER2 negative.  Patient has not had blood work done prior to current visit.  She has a plan to have blood work done at her PCPs office. Continue with Arimidex 1 mg daily. 10/19/2020, bilateral screening mammogram is negative for mammographic evidence of malignancy.   We will obtain annual screening mammogram in July 2022.  #Chronic aromatase inhiitor use, . DEXA October 2021 showed osteopenia with 10-year probability of fracture 7.3%, major osteoporotic fracture 0.4%.  Continue calcium and  vitamin D supplementation At  Family history of cancer, declined genetic testing. We spent sufficient time to discuss many aspect of care, questions were answered to patient's satisfaction.  Orders Placed This Encounter  Procedures   MM 3D SCREEN BREAST BILATERAL    Standing Status:   Future    Standing Expiration Date:   05/01/2022  Order Specific Question:   Reason for Exam (SYMPTOM  OR DIAGNOSIS REQUIRED)    Answer:   hx breast cancer    Order Specific Question:   Preferred imaging location?    Answer:    [facility]   CBC with Differential    Standing Status:   Future    Standing Expiration Date:   05/01/2022   Comprehensive metabolic panel    Standing Status:   Future    Standing Expiration Date:   05/01/2022    All questions were answered. The patient knows to call the clinic with any problems questions or concerns.  Return of visit: 6 months   Earlie Server, MD, PhD Hematology Oncology 05/01/2021

## 2021-10-20 ENCOUNTER — Ambulatory Visit
Admission: RE | Admit: 2021-10-20 | Discharge: 2021-10-20 | Disposition: A | Discharge status: Home / Self Care | Payer: BC Managed Care – PPO | Source: Ambulatory Visit | Attending: Oncology | Admitting: Oncology

## 2021-10-20 DIAGNOSIS — Z1231 Encounter for screening mammogram for malignant neoplasm of breast: Secondary | ICD-10-CM | POA: Insufficient documentation

## 2021-10-20 DIAGNOSIS — Z853 Personal history of malignant neoplasm of breast: Secondary | ICD-10-CM | POA: Insufficient documentation

## 2021-10-20 DIAGNOSIS — Z170 Estrogen receptor positive status [ER+]: Secondary | ICD-10-CM

## 2021-10-30 ENCOUNTER — Encounter: Payer: Self-pay | Admitting: Oncology

## 2021-10-30 ENCOUNTER — Inpatient Hospital Stay: Payer: Medicare PPO | Attending: Oncology | Admitting: Oncology

## 2021-10-30 VITALS — BP 124/65 | HR 77 | Temp 98.7°F | Resp 20 | Wt 195.5 lb

## 2021-10-30 DIAGNOSIS — M8580 Other specified disorders of bone density and structure, unspecified site: Secondary | ICD-10-CM | POA: Diagnosis not present

## 2021-10-30 DIAGNOSIS — Z923 Personal history of irradiation: Secondary | ICD-10-CM | POA: Diagnosis not present

## 2021-10-30 DIAGNOSIS — Z170 Estrogen receptor positive status [ER+]: Secondary | ICD-10-CM | POA: Insufficient documentation

## 2021-10-30 DIAGNOSIS — Z79811 Long term (current) use of aromatase inhibitors: Secondary | ICD-10-CM | POA: Diagnosis not present

## 2021-10-30 DIAGNOSIS — C50412 Malignant neoplasm of upper-outer quadrant of left female breast: Secondary | ICD-10-CM

## 2021-10-30 MED ORDER — ANASTROZOLE 1 MG PO TABS: 1.0000 mg | ORAL_TABLET | Freq: Every day | ORAL | 1 refills | Status: DC

## 2021-10-30 NOTE — Progress Notes (Signed)
Hematology/Oncology Progress note [phone] [phone]      Patient Care Team: Idelle Crouch, MD as PCP - General (Internal Medicine)  REFERRING PROVIDER: Idelle Crouch, MD CHIEF COMPLAINTS  follow-up for management of breast cancer  HISTORY OF PRESENTING ILLNESS:  Gail Rivas is a  66 y.o.  female with PMH listed below who was referred to me for  breast cancer. Patient had screening mammogram and ultrasound on 10/15/2017 which showed left breast possible mass warrant further evaluation. Diagnostic breast mammogram on 7/31 2019 showed 6 mm spiculated mass slight distortion in the outer left breast.  Targeted ultrasound was performed showing no definite sonographic correlate is identified in the left breast.  Left axilla is negative for lymphadenopathy.   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  Grade 1, ER PR positive [>90%], HER-2 negative  Family history: Maternal grandmother was diagnosed with ovarian cancer in the 28s.  Father had colon cancer. Patient declined genetic testing.  # # 11/21/2017 She is s/p lumpectomy and sentinel lymph node biopsy.  Pathology showed 51m invasive mammary carcinoma, grade 1, sentinel lymph node was negative.  stage 1A ER/PR +, HER2 negative.Her case was discussed at tumor board. Consensus was  No chemotherapy needed. adjuvant RT followed by anti estrogen treatment.  # # S/p Adjuvant RT finished 01/31/2018  # Started adjuvant antiestrogen with Letrozole on 11/21/ 2019. 04/04/2018 switch to anastrozole as letrozole is on national back order. # bilateral diagnostic mammogram done on 10/17/2018.  No mammographic evidence of malignancy. #10/19/2020, bilateral screening mammogram is negative for mammographic evidence of malignancy.    INTERVAL HISTORY Gail THORNSis a 66y.o. female who has above history reviewed by me today  presents for follow-up visit for management of pT1a N0, stage 1A ER/PR +, HER2 negative Breast cancer Patient has been on Arimidex 1 mg daily.  She tolerates well.  No new complaints.  Denies any breast concerns. She has had mammogram done prior to the appointment.      Review of Systems  Constitutional:  Negative for chills, fever, malaise/fatigue and weight loss.  HENT:  Negative for nosebleeds and sore throat.   Eyes:  Negative for double vision, photophobia and redness.  Respiratory:  Negative for cough, shortness of breath and wheezing.   Cardiovascular:  Negative for chest pain, palpitations, orthopnea and leg swelling.  Gastrointestinal:  Negative for abdominal pain, blood in stool, nausea and vomiting.  Genitourinary:  Negative for dysuria.  Musculoskeletal:  Negative for back pain, myalgias and neck pain.  Skin:  Negative for itching and rash.  Neurological:  Negative for dizziness, tingling and tremors.  Endo/Heme/Allergies:  Negative for environmental allergies. Does not bruise/bleed easily.  Psychiatric/Behavioral:  Negative for depression and hallucinations. The patient is not nervous/anxious.     MEDICAL HISTORY:  Past Medical History:  Diagnosis Date   Anxiety    Breast cancer ([place]   invasive mammary carcinoma- Left   Diabetes mellitus without complication (HCC)    Fibrocystic disease of breast    GERD (gastroesophageal reflux disease)    Headache    Hyperlipidemia    Hypertension    Personal history of radiation therapy 2019   LEFT lumpectomy   PONV (postoperative nausea and vomiting)    Colonoscopy   Sleep apnea     SURGICAL HISTORY: Past Surgical History:  Procedure Laterality Date   BREAST BIOPSY Left 10/30/2017   invasive mammary  carcinoma   BREAST CYST ASPIRATION Left    BREAST LUMPECTOMY Left 11/21/2017   invasive mammary carcinoma    COLONOSCOPY     COLONOSCOPY WITH PROPOFOL N/A 05/10/2017   Procedure: COLONOSCOPY WITH PROPOFOL;   Surgeon: Manya Silvas, MD;  Location: [facility] END[facility]COPY;  Service: Endoscopy;  Laterality: N/A;   PARTIAL MASTECTOMY WITH NEEDLE LOCALIZATION AND AXILLARY SENTINEL LYMPH NODE BX Left 11/21/2017   Procedure: PARTIAL MASTECTOMY WITH NEEDLE LOCALIZATION AND AXILLARY SENTINEL LYMPH NODE BX;  Surgeon: Benjamine Sprague, DO;  Location: [facility] ORS;  Service: General;  Laterality: Left;   wisdom teeth removal      SOCIAL HISTORY: Social History   Socioeconomic History   Marital status: Married    Spouse name: Not on file   Number of children: Not on file   Years of education: Not on file   Highest education level: Not on file  Occupational History   Not on file  Tobacco Use   Smoking status: Never   Smokeless tobacco: Never  Vaping Use   Vaping Use: Never used  Substance and Sexual Activity   Alcohol use: No   Drug use: No   Sexual activity: Yes  Other Topics Concern   Not on file  Social History Narrative   Not on file   Social Determinants of Health   Financial Resource Strain: Not on file  Food Insecurity: Not on file  Transportation Needs: Not on file  Physical Activity: Not on file  Stress: Not on file  Social Connections: Not on file  Intimate Partner Violence: Not on file    FAMILY HISTORY: Family History  Problem Relation Age of Onset   Hypertension Mother    Colon cancer Father    Liver cancer Father    Diabetes Father    Ovarian cancer Maternal Grandmother    Breast cancer Neg Hx     ALLERGIES:  is allergic to codeine, sulfa antibiotics, versed [midazolam], and penicillin v.  MEDICATIONS:  Current Outpatient Medications  Medication Sig Dispense Refill   calcium elemental as carbonate (BARIATRIC TUMS ULTRA) 400 MG chewable tablet Chew by mouth.     cetirizine (ZYRTEC) 10 MG tablet Take 10 mg by mouth daily.     Cholecalciferol 25 MCG (1000 UT) tablet Take 1,000 Units by mouth daily.     clotrimazole-betamethasone (LOTRISONE) cream Apply 1 application topically  daily as needed (eczema).      glimepiride (AMARYL) 4 MG tablet Take 4 mg by mouth 2 (two) times daily.     metFORMIN (GLUCOPHAGE) 1000 MG tablet Take 1,000 mg by mouth 2 (two) times daily with a meal.     metoprolol (TOPROL-XL) 200 MG 24 hr tablet Take 200 mg by mouth daily.     pioglitazone (ACTOS) 45 MG tablet Take 45 mg by mouth daily.     rosuvastatin (CRESTOR) 10 MG tablet Take by mouth.     sitaGLIPtin (JANUVIA) 100 MG tablet Take 100 mg by mouth daily.     telmisartan-hydrochlorothiazide (MICARDIS HCT) 80-25 MG tablet Take 1 tablet by mouth daily. Take 1 tablet by mouth daily      anastrozole (ARIMIDEX) 1 MG tablet Take 1 tablet (1 mg total) by mouth daily. 90 tablet 1   No current facility-administered medications for this visit.     PHYSICAL EXAMINATION: ECOG PERFORMANCE STATUS: 0 - Asymptomatic Vitals:   10/30/21 1335  BP: 124/65  Pulse: 77  Resp: 20  Temp: 98.7 F (37.1 C)  SpO2: 97%  Filed Weights   10/30/21 1335  Weight: 195 lb 8 oz (88.7 kg)    Physical Exam Constitutional:      General: She is not in acute distress. HENT:     Head: Normocephalic and atraumatic.  Eyes:     General: No scleral icterus. Cardiovascular:     Rate and Rhythm: Normal rate and regular rhythm.     Heart sounds: Normal heart sounds.  Pulmonary:     Effort: Pulmonary effort is normal. No respiratory distress.     Breath sounds: No wheezing.  Abdominal:     General: Bowel sounds are normal. There is no distension.     Palpations: Abdomen is soft.  Musculoskeletal:        General: No deformity. Normal range of motion.     Cervical back: Normal range of motion and neck supple.  Skin:    General: Skin is warm and dry.     Findings: No erythema or rash.  Neurological:     Mental Status: She is alert and oriented to person, place, and time. Mental status is at baseline.     Cranial Nerves: No cranial nerve deficit.     Coordination: Coordination normal.  Psychiatric:         Mood and Affect: Mood normal.     LABORATORY DATA:  I have reviewed the data as listed Lab Results  Component Value Date   WBC 6.2 01/19/2019   HGB 13.1 01/19/2019   HCT 40.0 01/19/2019   MCV 92.2 01/19/2019   PLT 155 01/19/2019   No results for input(s): "NA", "K", "CL", "CO2", "GLUCOSE", "BUN", "CREATININE", "CALCIUM", "GFRNONAA", "GFRAA", "PROT", "ALBUMIN", "AST", "ALT", "ALKPHOS", "BILITOT", "BILIDIR", "IBILI" in the last 8760 hours. Iron/TIBC/Ferritin/ %Sat No results found for: "IRON", "TIBC", "FERRITIN", "IRONPCTSAT"   RADIOGRAPHIC STUDIES: I have personally reviewed the radiological images as listed and agreed with the findings in the report. MM 3D SCREEN BREAST BILATERAL  Result Date: 10/23/2021 CLINICAL DATA:  Screening. EXAM: DIGITAL SCREENING BILATERAL MAMMOGRAM WITH TOMOSYNTHESIS AND CAD TECHNIQUE: Bilateral screening digital craniocaudal and mediolateral oblique mammograms were obtained. Bilateral screening digital breast tomosynthesis was performed. The images were evaluated with computer-aided detection. COMPARISON:  Previous exam(s). ACR Breast Density Category b: There are scattered areas of fibroglandular density. FINDINGS: There are no findings suspicious for malignancy. IMPRESSION: No mammographic evidence of malignancy. A result letter of this screening mammogram will be mailed directly to the patient. RECOMMENDATION: Screening mammogram in one year. (Code:SM-B-01Y) BI-RADS CATEGORY  1: Negative. Electronically Signed   By: Lillia Mountain M.D.   On: 10/23/2021 10:30      ASSESSMENT & PLAN:  1. Malignant neoplasm of upper-outer quadrant of left breast in female, estrogen receptor positive ([place])   2. Aromatase inhibitor use   3. Osteopenia, unspecified location    Cancer Staging  Malignant neoplasm of upper-outer quadrant of left breast in female, estrogen receptor positive ([place]) Staging form: Breast, AJCC 8th Edition - Clinical: No stage assigned - Unsigned -  Pathologic stage from 12/05/2017: Stage IA (pT1a, pN0, cM0, G1, ER+, PR+, HER2-) - Signed by Earlie Server, MD on 12/05/2017   # Breast Cancer, pT1a N0, stage 1A ER/PR +, HER2 negative.  Continue with Arimidex 1 mg daily. 10/20/2021, bilateral screening mammogram negative for malignancy.  #Chronic aromatase inhiitor use, . DEXA October 2021 showed osteopenia with 10-year probability of fracture 7.3%, major osteoporotic fracture 0.4%.  Continue calcium and vitamin D supplementation Repeat DEXA end of 2023.  Family history of  cancer, declined genetic testing. We spent sufficient time to discuss many aspect of care, questions were answered to patient's satisfaction.  Orders Placed This Encounter  Procedures   DG Bone Density    Standing Status:   Future    Standing Expiration Date:   10/31/2022    Order Specific Question:   Reason for Exam (SYMPTOM  OR DIAGNOSIS REQUIRED)    Answer:   breast cancer with Aromatase inhibitor use    Order Specific Question:   Preferred imaging location?    Answer:   [facility]   CBC with Differential/Platelet    Standing Status:   Future    Standing Expiration Date:   10/31/2022   Comprehensive metabolic panel    Standing Status:   Future    Standing Expiration Date:   10/30/2022    All questions were answered. The patient knows to call the clinic with any problems questions or concerns.  Return of visit: 6 months   Earlie Server, MD, PhD Hematology Oncology 10/30/2021

## 2022-03-21 ENCOUNTER — Ambulatory Visit
Admission: RE | Admit: 2022-03-21 | Discharge: 2022-03-21 | Disposition: A | Discharge status: Home / Self Care | Payer: Medicare PPO | Source: Ambulatory Visit | Attending: Oncology | Admitting: Oncology

## 2022-03-21 DIAGNOSIS — C50412 Malignant neoplasm of upper-outer quadrant of left female breast: Secondary | ICD-10-CM | POA: Insufficient documentation

## 2022-03-21 DIAGNOSIS — E119 Type 2 diabetes mellitus without complications: Secondary | ICD-10-CM | POA: Insufficient documentation

## 2022-03-21 DIAGNOSIS — M8588 Other specified disorders of bone density and structure, other site: Secondary | ICD-10-CM | POA: Insufficient documentation

## 2022-03-21 DIAGNOSIS — Z13820 Encounter for screening for osteoporosis: Secondary | ICD-10-CM | POA: Diagnosis not present

## 2022-03-21 DIAGNOSIS — Z170 Estrogen receptor positive status [ER+]: Secondary | ICD-10-CM | POA: Diagnosis present

## 2022-03-21 DIAGNOSIS — Z923 Personal history of irradiation: Secondary | ICD-10-CM | POA: Diagnosis not present

## 2022-05-02 ENCOUNTER — Encounter: Payer: Self-pay | Admitting: Oncology

## 2022-05-02 ENCOUNTER — Inpatient Hospital Stay: Payer: Medicare PPO | Attending: Oncology | Admitting: Oncology

## 2022-05-02 ENCOUNTER — Other Ambulatory Visit: Payer: Medicare PPO

## 2022-05-02 VITALS — BP 139/65 | HR 78 | Temp 97.4°F | Resp 18 | Wt 197.2 lb

## 2022-05-02 DIAGNOSIS — Z79811 Long term (current) use of aromatase inhibitors: Secondary | ICD-10-CM

## 2022-05-02 DIAGNOSIS — Z170 Estrogen receptor positive status [ER+]: Secondary | ICD-10-CM | POA: Diagnosis not present

## 2022-05-02 DIAGNOSIS — C50412 Malignant neoplasm of upper-outer quadrant of left female breast: Secondary | ICD-10-CM

## 2022-05-02 DIAGNOSIS — M8580 Other specified disorders of bone density and structure, unspecified site: Secondary | ICD-10-CM | POA: Diagnosis not present

## 2022-05-02 DIAGNOSIS — Z79899 Other long term (current) drug therapy: Secondary | ICD-10-CM | POA: Diagnosis not present

## 2022-05-02 NOTE — Assessment & Plan Note (Signed)
Dec 2023 DEXA showed osteopenia  major osteoporotic fracture is 7.3% within the next ten years. Continue calcium and vitamin D supplementation.  Repeat DEXA every 2 years.

## 2022-05-02 NOTE — Assessment & Plan Note (Addendum)
Breast Cancer, pT1a N0, stage 1A ER/PR +, HER2 negative.  Continue with Arimidex 1 mg daily. Obtain mammogram in July 2024

## 2022-05-02 NOTE — Assessment & Plan Note (Addendum)
#  Chronic aromatase inhiitor use, . Continue calcium and vitamin D supplementation

## 2022-05-02 NOTE — Progress Notes (Signed)
Hematology/Oncology Progress note [phone] [phone]    CHIEF COMPLAINTS Follow-up for management of breast cancer  ASSESSMENT & PLAN:   Malignant neoplasm of upper-outer quadrant of left breast in female, estrogen receptor positive (HCC)  Breast Cancer, pT1a N0, stage 1A ER/PR +, HER2 negative.  Continue with Arimidex 1 mg daily. Obtain mammogram in July 2024  Aromatase inhibitor use #Chronic aromatase inhiitor use, . Continue calcium and vitamin D supplementation  Osteopenia Dec 2023 DEXA showed osteopenia  major osteoporotic fracture is 7.3% within the next ten years. Continue calcium and vitamin D supplementation.  Repeat DEXA every 2 years.   Orders Placed This Encounter  Procedures   MM 3D SCREEN BREAST BILATERAL    Standing Status:   Future    Standing Expiration Date:   05/03/2023    Order Specific Question:   Reason for Exam (SYMPTOM  OR DIAGNOSIS REQUIRED)    Answer:   Breast cancer    Order Specific Question:   Preferred imaging location?    Answer:   [facility]   Follow up in 6 months.  All questions were answered. The patient knows to call the clinic with any problems, questions or concerns.  Earlie Server, MD, PhD [facility] Hematology Oncology 05/02/2022    HISTORY OF PRESENTING ILLNESS:  Gail Rivas is a  67 y.o.  female with PMH listed below who was referred to me for  breast cancer. Patient had screening mammogram and ultrasound on 10/15/2017 which showed left breast possible mass warrant further evaluation. Diagnostic breast mammogram on 7/31 2019 showed 6 mm spiculated mass slight distortion in the outer left breast.  Targeted ultrasound was performed showing no definite sonographic correlate is identified in the left breast.  Left axilla is negative for lymphadenopathy.   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  Grade 1, ER PR  positive [>90%], HER-2 negative  Family history: Maternal grandmother was diagnosed with ovarian cancer in the 87s.  Father had colon cancer. Patient declined genetic testing.  # # 11/21/2017 She is s/p lumpectomy and sentinel lymph node biopsy.  Pathology showed 20m invasive mammary carcinoma, grade 1, sentinel lymph node was negative.  stage 1A ER/PR +, HER2 negative.Her case was discussed at tumor board. Consensus was  No chemotherapy needed. adjuvant RT followed by anti estrogen treatment.  # # S/p Adjuvant RT finished 01/31/2018  # Started adjuvant antiestrogen with Letrozole on 11/21/ 2019. 04/04/2018 switch to anastrozole as letrozole is on national back order. # bilateral diagnostic mammogram done on 10/17/2018.  No mammographic evidence of malignancy. #10/19/2020, bilateral screening mammogram is negative for mammographic evidence of malignancy.   # DEXA October 2021 showed osteopenia with 10-year probability of fracture 7.3%, major osteoporotic fracture 0.4%.  #10/20/2021, bilateral screening mammogram negative for malignancy.  Family history of cancer, declined genetic testing.  INTERVAL HISTORY Gail MELLOis a 67y.o. female who has above history reviewed by me today presents for follow-up visit for management of pT1a N0, stage 1A ER/PR +, HER2 negative Breast cancer Patient has been on Arimidex 1 mg daily.  She tolerates well.  No new complaints.  Denies any breast concerns.   Review of Systems  Constitutional:  Negative for chills, fever, malaise/fatigue and weight loss.  HENT:  Negative for nosebleeds and sore throat.   Eyes:  Negative for double vision, photophobia and redness.  Respiratory:  Negative for cough, shortness of breath and wheezing.  Cardiovascular:  Negative for chest pain, palpitations, orthopnea and leg swelling.  Gastrointestinal:  Negative for abdominal pain, blood in stool, nausea and vomiting.  Genitourinary:  Negative for dysuria.   Musculoskeletal:  Negative for back pain, myalgias and neck pain.  Skin:  Negative for itching and rash.  Neurological:  Negative for dizziness, tingling and tremors.  Endo/Heme/Allergies:  Negative for environmental allergies. Does not bruise/bleed easily.  Psychiatric/Behavioral:  Negative for depression and hallucinations. The patient is not nervous/anxious.     MEDICAL HISTORY:  Past Medical History:  Diagnosis Date   Anxiety    Breast cancer ([place]) 10/2017   invasive mammary carcinoma- Left   Diabetes mellitus without complication (HCC)    Fibrocystic disease of breast    GERD (gastroesophageal reflux disease)    Headache    Hyperlipidemia    Hypertension    Personal history of radiation therapy 2019   LEFT lumpectomy   PONV (postoperative nausea and vomiting)    Colonoscopy   Sleep apnea     SURGICAL HISTORY: Past Surgical History:  Procedure Laterality Date   BREAST BIOPSY Left 10/30/2017   invasive mammary carcinoma   BREAST CYST ASPIRATION Left    BREAST LUMPECTOMY Left 11/21/2017   invasive mammary carcinoma    COLONOSCOPY     COLONOSCOPY WITH PROPOFOL N/A 05/10/2017   Procedure: COLONOSCOPY WITH PROPOFOL;  Surgeon: Manya Silvas, MD;  Location: [facility]COPY;  Service: Endoscopy;  Laterality: N/A;   PARTIAL MASTECTOMY WITH NEEDLE LOCALIZATION AND AXILLARY SENTINEL LYMPH NODE BX Left 11/21/2017   Procedure: PARTIAL MASTECTOMY WITH NEEDLE LOCALIZATION AND AXILLARY SENTINEL LYMPH NODE BX;  Surgeon: Benjamine Sprague, DO;  Location: [facility] ORS;  Service: General;  Laterality: Left;   wisdom teeth removal      SOCIAL HISTORY: Social History   Socioeconomic History   Marital status: Married    Spouse name: Not on file   Number of children: Not on file   Years of education: Not on file   Highest education level: Not on file  Occupational History   Not on file  Tobacco Use   Smoking status: Never   Smokeless tobacco: Never  Vaping Use   Vaping Use: Never used   Substance and Sexual Activity   Alcohol use: No   Drug use: No   Sexual activity: Yes  Other Topics Concern   Not on file  Social History Narrative   Not on file   Social Determinants of Health   Financial Resource Strain: Not on file  Food Insecurity: Not on file  Transportation Needs: Not on file  Physical Activity: Not on file  Stress: Not on file  Social Connections: Not on file  Intimate Partner Violence: Not on file    FAMILY HISTORY: Family History  Problem Relation Age of Onset   Hypertension Mother    Colon cancer Father    Liver cancer Father    Diabetes Father    Ovarian cancer Maternal Grandmother    Breast cancer Neg Hx     ALLERGIES:  is allergic to codeine, sulfa antibiotics, versed [midazolam], and penicillin v.  MEDICATIONS:  Current Outpatient Medications  Medication Sig Dispense Refill   anastrozole (ARIMIDEX) 1 MG tablet Take 1 tablet (1 mg total) by mouth daily. 90 tablet 1   calcium elemental as carbonate (BARIATRIC TUMS ULTRA) 400 MG chewable tablet Chew by mouth.     cetirizine (ZYRTEC) 10 MG tablet Take 10 mg by mouth daily.     Cholecalciferol 25  MCG (1000 UT) tablet Take 1,000 Units by mouth daily.     clotrimazole-betamethasone (LOTRISONE) cream Apply 1 application topically daily as needed (eczema).      glimepiride (AMARYL) 4 MG tablet Take 4 mg by mouth 2 (two) times daily.     metFORMIN (GLUCOPHAGE) 1000 MG tablet Take 1,000 mg by mouth 2 (two) times daily with a meal.     metoprolol (TOPROL-XL) 200 MG 24 hr tablet Take 200 mg by mouth daily.     pioglitazone (ACTOS) 45 MG tablet Take 45 mg by mouth daily.     rosuvastatin (CRESTOR) 10 MG tablet Take by mouth.     sitaGLIPtin (JANUVIA) 100 MG tablet Take 100 mg by mouth daily.     telmisartan-hydrochlorothiazide (MICARDIS HCT) 80-25 MG tablet Take 1 tablet by mouth daily. Take 1 tablet by mouth daily      No current facility-administered medications for this visit.     PHYSICAL  EXAMINATION: ECOG PERFORMANCE STATUS: 0 - Asymptomatic Vitals:   05/02/22 1349  BP: 139/65  Pulse: 78  Resp: 18  Temp: (!) 97.4 F (36.3 C)   Filed Weights   05/02/22 1349  Weight: 197 lb 3.2 oz (89.4 kg)    Physical Exam Constitutional:      General: She is not in acute distress. HENT:     Head: Normocephalic and atraumatic.  Eyes:     General: No scleral icterus. Cardiovascular:     Rate and Rhythm: Normal rate and regular rhythm.     Heart sounds: Normal heart sounds.  Pulmonary:     Effort: Pulmonary effort is normal. No respiratory distress.     Breath sounds: No wheezing.  Abdominal:     General: Bowel sounds are normal. There is no distension.     Palpations: Abdomen is soft.  Musculoskeletal:        General: No deformity. Normal range of motion.     Cervical back: Normal range of motion and neck supple.  Skin:    General: Skin is warm and dry.     Findings: No erythema or rash.  Neurological:     Mental Status: She is alert and oriented to person, place, and time. Mental status is at baseline.     Cranial Nerves: No cranial nerve deficit.     Coordination: Coordination normal.  Psychiatric:        Mood and Affect: Mood normal.     LABORATORY DATA:  I have reviewed the data as listed    Latest Ref Rng & Units 01/19/2019    8:45 AM 10/20/2018    9:33 AM 07/07/2018    1:27 PM  CBC  WBC 4.0 - 10.5 K/uL 6.2  5.9  8.1   Hemoglobin 12.0 - 15.0 g/dL 13.1  13.4  13.6   Hematocrit 36.0 - 46.0 % 40.0  40.5  41.1   Platelets 150 - 400 K/uL 155  151  195       Latest Ref Rng & Units 01/19/2019    8:45 AM 10/20/2018    9:33 AM 07/07/2018    1:27 PM  CMP  Glucose 70 - 99 mg/dL 149  275  159   BUN 8 - 23 mg/dL '14  18  18   '$ Creatinine 0.44 - 1.00 mg/dL 0.75  0.82  0.79   Sodium 135 - 145 mmol/L 137  136  137   Potassium 3.5 - 5.1 mmol/L 4.2  3.8  4.4   Chloride 98 - 111  mmol/L 103  101  102   CO2 22 - 32 mmol/L '26  23  23   '$ Calcium 8.9 - 10.3 mg/dL 9.3  9.5   9.2   Total Protein 6.5 - 8.1 g/dL 6.6  7.1  7.1   Total Bilirubin 0.3 - 1.2 mg/dL 1.3  1.3  1.1   Alkaline Phos 38 - 126 U/L 60  60  64   AST 15 - 41 U/L '22  21  23   '$ ALT 0 - 44 U/L '23  26  31     '$ RADIOGRAPHIC STUDIES: I have personally reviewed the radiological images as listed and agreed with the findings in the report. DG Bone Density  Result Date: 03/21/2022 EXAM: DUAL X-RAY ABSORPTIOMETRY (DXA) FOR BONE MINERAL DENSITY IMPRESSION: Your patient Nesiah Jump completed a BMD test on 03/21/2022 using the [place] (software version: 14.10) manufactured by [employer]. The following summarizes the results of our evaluation. Technologist:VLM PATIENT BIOGRAPHICAL: Name: Michal, Strzelecki Patient ID: [id number] Birth Date: May 12, 1955 Height: 63.0 in. Gender: Female Exam Date: 03/21/2022 Weight: 188.0 lbs. Indications: History of Breast Cancer, Caucasian, Postmenopausal, High Risk Meds, Diabetic, History of Radiation Fractures: Treatments: Anastrozole, Glimepiride, Januvia, Calcium, Vitamin D, Metformin DENSITOMETRY RESULTS: Site      Region     Measured Date Measured Age WHO Classification Young Adult T-score BMD         %Change vs. Previous Significant Change (*) AP Spine L1-L4 03/21/2023 65.3 Osteopenia -1.1 1.063 g/cm2 0.4% - AP Spine L1-L4 12/30/2019 63.1 Osteopenia -1.1 1.059 g/cm2 -4.5% Yes AP Spine L1-L4 12/26/2017 61.1 Normal -0.7 1.109 g/cm2 - - DualFemur Neck Right 03/21/2022 65.3 Normal -0.8 0.923 g/cm2 0.4% - DualFemur Neck Right 12/30/2019 63.1 Normal -0.9 0.919 g/cm2 2.1% - DualFemur Neck Right 12/26/2017 61.1 Normal -1.0 0.900 g/cm2 - - DualFemur Total Mean 03/21/2022 65.3 Normal -0.3 0.970 g/cm2 2.0% Yes DualFemur Total Mean 12/30/2019 63.1 Normal -0.4 0.951 g/cm2 0.2% - DualFemur Total Mean 12/26/2017 61.1 Normal -0.5 0.949 g/cm2 - - ASSESSMENT: The BMD measured at AP Spine L1-L4 is 1.063 g/cm2 with a T-score of -1.1. This patient is considered osteopenic  according to [facility]) criteria. Compared with prior study, there has been no significant change in the spine. Compared with prior study, there has been significant decrease in the total hip. The scan quality is good. World Pharmacologist [facility]) criteria for post-menopausal, Caucasian Women: Normal:                   T-score at or above -1 SD Osteopenia/low bone mass: T-score between -1 and -2.5 SD Osteoporosis:             T-score at or below -2.5 SD RECOMMENDATIONS: 1. All patients should optimize calcium and vitamin D intake. 2. Consider FDA-approved medical therapies in postmenopausal women and men aged 67 years and older, based on the following: a. A hip or vertebral(clinical or morphometric) fracture b. T-score < -2.5 at the femoral neck or spine after appropriate evaluation to exclude secondary causes c. Low bone mass (T-score between -1.0 and -2.5 at the femoral neck or spine) and a 10-year probability of a hip fracture > 3% or a 10-year probability of a major osteoporosis-related fracture > 20% based on the [place]-adapted WHO algorithm 3. Clinician judgment and/or patient preferences may indicate treatment for people with 10-year fracture probabilities above or below these levels FOLLOW-UP: People with diagnosed cases of osteoporosis or at high risk  for fracture should have regular bone mineral density tests. For patients eligible for Medicare, routine testing is allowed once every 2 years. The testing frequency can be increased to one year for patients who have rapidly progressing disease, those who are receiving or discontinuing medical therapy to restore bone mass, or have additional risk factors. I have reviewed this report, and agree with the above findings. [facility] Dear Earlie Server, Your patient NAINIKA NEWLUN completed a FRAX assessment on 03/21/2022 using the [place] (analysis version: 14.10) manufactured by [employer]. The following  summarizes the results of our evaluation. PATIENT BIOGRAPHICAL: Name: Delora, Gravatt Patient ID: [id number] Birth Date: 1956-01-22 Height:    63.0 in. Gender:     Female    [age]       Weight:    188.0 lbs. Ethnicity:  White                            Exam Date: 03/21/2022 FRAX* RESULTS:  (version: 3.5) 10-year Probability of Fracture1 Major Osteoporotic Fracture2 Hip Fracture 7.3% 0.4% Population: [place] (Caucasian) Risk Factors: None Based on Femur (Right) Neck BMD 1 -The 10-year probability of fracture may be lower than reported if the patient has received treatment. 2 -Major Osteoporotic Fracture: Clinical Spine, Forearm, Hip or Shoulder *FRAX is a Materials engineer of the [employer] for Metabolic Bone Disease, a [place] (WHO) [facility]. ASSESSMENT: The probability of a major osteoporotic fracture is 7.3% within the next ten years. The probability of a hip fracture is 0.4% within the next ten years. . Electronically Signed   By: Zerita Boers M.D.   On: 03/21/2022 13:32

## 2022-05-02 NOTE — Progress Notes (Signed)
Pt here for follow up. No new concerns voiced. No new breast problems  

## 2022-10-15 ENCOUNTER — Other Ambulatory Visit: Payer: Self-pay | Admitting: Oncology

## 2022-10-23 ENCOUNTER — Ambulatory Visit
Admission: RE | Admit: 2022-10-23 | Discharge: 2022-10-23 | Disposition: A | Discharge status: Home / Self Care | Payer: Medicare PPO | Source: Ambulatory Visit | Attending: Oncology | Admitting: Oncology

## 2022-10-23 DIAGNOSIS — Z1231 Encounter for screening mammogram for malignant neoplasm of breast: Secondary | ICD-10-CM | POA: Diagnosis not present

## 2022-10-23 DIAGNOSIS — C50412 Malignant neoplasm of upper-outer quadrant of left female breast: Secondary | ICD-10-CM | POA: Insufficient documentation

## 2022-10-23 DIAGNOSIS — Z170 Estrogen receptor positive status [ER+]: Secondary | ICD-10-CM | POA: Diagnosis not present

## 2022-10-31 ENCOUNTER — Encounter: Payer: Self-pay | Admitting: Oncology

## 2022-10-31 ENCOUNTER — Inpatient Hospital Stay: Payer: Medicare PPO | Attending: Oncology | Admitting: Oncology

## 2022-10-31 VITALS — BP 98/61 | HR 73 | Temp 96.4°F | Resp 18 | Wt 196.5 lb

## 2022-10-31 DIAGNOSIS — C50412 Malignant neoplasm of upper-outer quadrant of left female breast: Secondary | ICD-10-CM | POA: Insufficient documentation

## 2022-10-31 DIAGNOSIS — Z79811 Long term (current) use of aromatase inhibitors: Secondary | ICD-10-CM | POA: Insufficient documentation

## 2022-10-31 DIAGNOSIS — Z170 Estrogen receptor positive status [ER+]: Secondary | ICD-10-CM | POA: Insufficient documentation

## 2022-10-31 DIAGNOSIS — M8580 Other specified disorders of bone density and structure, unspecified site: Secondary | ICD-10-CM | POA: Diagnosis not present

## 2022-10-31 MED ORDER — ANASTROZOLE 1 MG PO TABS: 1.0000 mg | ORAL_TABLET | Freq: Every day | ORAL | 1 refills | Status: DC

## 2022-10-31 NOTE — Assessment & Plan Note (Signed)
Dec 2023 DEXA showed osteopenia  major osteoporotic fracture is 7.3% within the next ten years. Continue calcium and vitamin D supplementation.  Repeat DEXA every 2 years.

## 2022-10-31 NOTE — Assessment & Plan Note (Signed)
#  Chronic aromatase inhiitor use, . Continue calcium and vitamin D supplementation

## 2022-10-31 NOTE — Progress Notes (Signed)
Hematology/Oncology Progress note [phone] [phone]    CHIEF COMPLAINTS Follow-up for management of breast cancer  ASSESSMENT & PLAN:   Malignant neoplasm of upper-outer quadrant of left breast in female, estrogen receptor positive (HCC)  Breast Cancer, pT1a N0, stage 1A ER/PR +, HER2 negative.  Continue with Arimidex 1 mg daily. [ AI since 11/21/ 2019]  Patient will be finishing 5 years of aromatase inhibitor treatments in November 2024. Discussed with patient about option of stopping treatments versus extended endocrine therapy Patient desires to continue AI. mammogram in July 2024 results were reviewed.  Continue annual mammogram - July 2025   Aromatase inhibitor use #Chronic aromatase inhiitor use, . Continue calcium and vitamin D supplementation  Osteopenia Dec 2023 DEXA showed osteopenia  major osteoporotic fracture is 7.3% within the next ten years. Continue calcium and vitamin D supplementation.  Repeat DEXA every 2 years.    Follow up in 6 months.  All questions were answered. The patient knows to call the clinic with any problems, questions or concerns.  Rickard Patience, MD, PhD [facility] Hematology Oncology 10/31/2022    HISTORY OF PRESENTING ILLNESS:  Gail Rivas is a  67 y.o.  female with PMH listed below who was referred to me for  breast cancer. Patient had screening mammogram and ultrasound on 10/15/2017 which showed left breast possible mass warrant further evaluation. Diagnostic breast mammogram on 7/31 2019 showed 6 mm spiculated mass slight distortion in the outer left breast.  Targeted ultrasound was performed showing no definite sonographic correlate is identified in the left breast.  Left axilla is negative for lymphadenopathy.   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  Grade 1, ER PR positive [>90%], HER-2 negative  Family history:  Maternal grandmother was diagnosed with ovarian cancer in the 65s.  Father had colon cancer. Patient declined genetic testing.  # # 11/21/2017 She is s/p lumpectomy and sentinel lymph node biopsy.  Pathology showed 5mm invasive mammary carcinoma, grade 1, sentinel lymph node was negative.  stage 1A ER/PR +, HER2 negative.Her case was discussed at tumor board. Consensus was  No chemotherapy needed. adjuvant RT followed by anti estrogen treatment.  # # S/p Adjuvant RT finished 01/31/2018  # Started adjuvant antiestrogen with Letrozole on 11/21/ 2019. 04/04/2018 switch to anastrozole as letrozole is on national back order. # bilateral diagnostic mammogram done on 10/17/2018.  No mammographic evidence of malignancy. #10/19/2020, bilateral screening mammogram is negative for mammographic evidence of malignancy.   # DEXA October 2021 showed osteopenia with 10-year probability of fracture 7.3%, major osteoporotic fracture 0.4%.  #10/20/2021, bilateral screening mammogram negative for malignancy.  Family history of cancer, declined genetic testing.  INTERVAL HISTORY Gail Rivas is a 67 y.o. female who has above history reviewed by me today presents for follow-up visit for management of pT1a N0, stage 1A ER/PR +, HER2 negative Breast cancer Patient has been on Arimidex 1 mg daily.  She tolerates well.  No new complaints.  Denies any breast concerns.   Review of Systems  Constitutional:  Negative for chills, fever, malaise/fatigue and weight loss.  HENT:  Negative for nosebleeds and sore throat.   Eyes:  Negative for double vision, photophobia and redness.  Respiratory:  Negative for cough, shortness of breath and wheezing.   Cardiovascular:  Negative for chest pain, palpitations, orthopnea and leg swelling.  Gastrointestinal:  Negative for abdominal pain, blood in stool, nausea and vomiting.  Genitourinary:  Negative for dysuria.  Musculoskeletal:  Negative for back pain, myalgias and neck  pain.  Skin:  Negative for itching and rash.  Neurological:  Negative for dizziness, tingling and tremors.  Endo/Heme/Allergies:  Negative for environmental allergies. Does not bruise/bleed easily.  Psychiatric/Behavioral:  Negative for depression and hallucinations. The patient is not nervous/anxious.     MEDICAL HISTORY:  Past Medical History:  Diagnosis Date   Anxiety    Breast cancer (HCC) 10/2017   invasive mammary carcinoma- Left   Diabetes mellitus without complication (HCC)    Fibrocystic disease of breast    GERD (gastroesophageal reflux disease)    Headache    Hyperlipidemia    Hypertension    Personal history of radiation therapy 2019   LEFT lumpectomy   PONV (postoperative nausea and vomiting)    Colonoscopy   Sleep apnea     SURGICAL HISTORY: Past Surgical History:  Procedure Laterality Date   BREAST BIOPSY Left 10/30/2017   invasive mammary carcinoma   BREAST CYST ASPIRATION Left    BREAST LUMPECTOMY Left 11/21/2017   invasive mammary carcinoma    COLONOSCOPY     COLONOSCOPY WITH PROPOFOL N/A 05/10/2017   Procedure: COLONOSCOPY WITH PROPOFOL;  Surgeon: Scot Jun, MD;  Location: [facility] ENDOSCOPY;  Service: Endoscopy;  Laterality: N/A;   PARTIAL MASTECTOMY WITH NEEDLE LOCALIZATION AND AXILLARY SENTINEL LYMPH NODE BX Left 11/21/2017   Procedure: PARTIAL MASTECTOMY WITH NEEDLE LOCALIZATION AND AXILLARY SENTINEL LYMPH NODE BX;  Surgeon: Sung Amabile, DO;  Location: [facility] ORS;  Service: General;  Laterality: Left;   wisdom teeth removal      SOCIAL HISTORY: Social History   Socioeconomic History   Marital status: Married    Spouse name: Not on file   Number of children: Not on file   Years of education: Not on file   Highest education level: Not on file  Occupational History   Not on file  Tobacco Use   Smoking status: Never   Smokeless tobacco: Never  Vaping Use   Vaping status: Never Used  Substance and Sexual Activity   Alcohol use: No   Drug  use: No   Sexual activity: Yes  Other Topics Concern   Not on file  Social History Narrative   Not on file   Social Determinants of Health   Financial Resource Strain: Not on file  Food Insecurity: Not on file  Transportation Needs: Not on file  Physical Activity: Not on file  Stress: Not on file  Social Connections: Not on file  Intimate Partner Violence: Not on file    FAMILY HISTORY: Family History  Problem Relation Age of Onset   Hypertension Mother    Colon cancer Father    Liver cancer Father    Diabetes Father    Ovarian cancer Maternal Grandmother    Breast cancer Neg Hx     ALLERGIES:  is allergic to codeine, sulfa antibiotics, versed [midazolam], and penicillin v.  MEDICATIONS:  Current Outpatient Medications  Medication Sig Dispense Refill   calcium elemental as carbonate (BARIATRIC TUMS ULTRA) 400 MG chewable tablet Chew by mouth.     cetirizine (ZYRTEC) 10 MG tablet Take 10 mg by mouth daily.     Cholecalciferol 25 MCG (1000 UT) tablet Take 1,000 Units by mouth daily.     clotrimazole-betamethasone (LOTRISONE) cream Apply 1 application topically daily as needed (eczema).      glimepiride (AMARYL) 4 MG tablet Take 4 mg by mouth 2 (two) times daily.  metFORMIN (GLUCOPHAGE) 1000 MG tablet Take 1,000 mg by mouth 2 (two) times daily with a meal.     metoprolol (TOPROL-XL) 200 MG 24 hr tablet Take 200 mg by mouth daily.     pioglitazone (ACTOS) 45 MG tablet Take 45 mg by mouth daily.     rosuvastatin (CRESTOR) 10 MG tablet Take by mouth.     Semaglutide 7 MG TABS Take by mouth.     sitaGLIPtin (JANUVIA) 100 MG tablet Take 100 mg by mouth daily.     telmisartan-hydrochlorothiazide (MICARDIS HCT) 80-25 MG tablet Take 1 tablet by mouth daily. Take 1 tablet by mouth daily      anastrozole (ARIMIDEX) 1 MG tablet Take 1 tablet (1 mg total) by mouth daily. 90 tablet 1   No current facility-administered medications for this visit.     PHYSICAL EXAMINATION: ECOG  PERFORMANCE STATUS: 0 - Asymptomatic Vitals:   10/31/22 1302  BP: 98/61  Pulse: 73  Resp: 18  Temp: (!) 96.4 F (35.8 C)  SpO2: 93%   Filed Weights   10/31/22 1302  Weight: 196 lb 8 oz (89.1 kg)    Physical Exam Constitutional:      General: She is not in acute distress. HENT:     Head: Normocephalic and atraumatic.  Eyes:     General: No scleral icterus. Cardiovascular:     Rate and Rhythm: Normal rate and regular rhythm.     Heart sounds: Normal heart sounds.  Pulmonary:     Effort: Pulmonary effort is normal. No respiratory distress.     Breath sounds: No wheezing.  Abdominal:     General: Bowel sounds are normal. There is no distension.     Palpations: Abdomen is soft.  Musculoskeletal:        General: No deformity. Normal range of motion.     Cervical back: Normal range of motion and neck supple.  Skin:    General: Skin is warm and dry.     Findings: No erythema or rash.  Neurological:     Mental Status: She is alert and oriented to person, place, and time. Mental status is at baseline.     Cranial Nerves: No cranial nerve deficit.     Coordination: Coordination normal.  Psychiatric:        Mood and Affect: Mood normal.     LABORATORY DATA:  I have reviewed the data as listed    Latest Ref Rng & Units 01/19/2019    8:45 AM 10/20/2018    9:33 AM 07/07/2018    1:27 PM  CBC  WBC 4.0 - 10.5 K/uL 6.2  5.9  8.1   Hemoglobin 12.0 - 15.0 g/dL 54.0  98.1  19.1   Hematocrit 36.0 - 46.0 % 40.0  40.5  41.1   Platelets 150 - 400 K/uL 155  151  195       Latest Ref Rng & Units 01/19/2019    8:45 AM 10/20/2018    9:33 AM 07/07/2018    1:27 PM  CMP  Glucose 70 - 99 mg/dL 478  295  621   BUN 8 - 23 mg/dL 14  18  18    Creatinine 0.44 - 1.00 mg/dL 3.08  6.57  8.46   Sodium 135 - 145 mmol/L 137  136  137   Potassium 3.5 - 5.1 mmol/L 4.2  3.8  4.4   Chloride 98 - 111 mmol/L 103  101  102   CO2 22 - 32 mmol/L 26  23  23   Calcium 8.9 - 10.3 mg/dL 9.3  9.5  9.2    Total Protein 6.5 - 8.1 g/dL 6.6  7.1  7.1   Total Bilirubin 0.3 - 1.2 mg/dL 1.3  1.3  1.1   Alkaline Phos 38 - 126 U/L 60  60  64   AST 15 - 41 U/L 22  21  23    ALT 0 - 44 U/L 23  26  31      RADIOGRAPHIC STUDIES: I have personally reviewed the radiological images as listed and agreed with the findings in the report. MM 3D SCREEN BREAST BILATERAL  Result Date: 10/24/2022 CLINICAL DATA:  Screening. EXAM: DIGITAL SCREENING BILATERAL MAMMOGRAM WITH TOMOSYNTHESIS AND CAD TECHNIQUE: Bilateral screening digital craniocaudal and mediolateral oblique mammograms were obtained. Bilateral screening digital breast tomosynthesis was performed. The images were evaluated with computer-aided detection. COMPARISON:  Previous exam(s). ACR Breast Density Category b: There are scattered areas of fibroglandular density. FINDINGS: There are no findings suspicious for malignancy. IMPRESSION: No mammographic evidence of malignancy. A result letter of this screening mammogram will be mailed directly to the patient. RECOMMENDATION: Screening mammogram in one year. (Code:SM-B-01Y) BI-RADS CATEGORY  1: Negative. Electronically Signed   By: Sande Brothers M.D.   On: 10/24/2022 12:42

## 2022-10-31 NOTE — Assessment & Plan Note (Addendum)
Breast Cancer, pT1a N0, stage 1A ER/PR +, HER2 negative.  Continue with Arimidex 1 mg daily. [ AI since 11/21/ 2019]  Patient will be finishing 5 years of aromatase inhibitor treatments in November 2024. Discussed with patient about option of stopping treatments versus extended endocrine therapy Patient desires to continue AI. mammogram in July 2024 results were reviewed.  Continue annual mammogram - July 2025

## 2022-12-17 ENCOUNTER — Encounter: Payer: Self-pay | Admitting: *Deleted

## 2022-12-18 ENCOUNTER — Ambulatory Visit
Admission: RE | Admit: 2022-12-18 | Discharge: 2022-12-18 | Disposition: A | Discharge status: Home / Self Care | Payer: Medicare PPO | Attending: Gastroenterology | Admitting: Gastroenterology

## 2022-12-18 ENCOUNTER — Ambulatory Visit: Payer: Medicare PPO | Admitting: Certified Registered"

## 2022-12-18 ENCOUNTER — Encounter: Payer: Self-pay | Admitting: *Deleted

## 2022-12-18 ENCOUNTER — Encounter
Admission: RE | Disposition: A | Discharge status: Home / Self Care | Payer: Self-pay | Source: Home / Self Care | Attending: Gastroenterology

## 2022-12-18 DIAGNOSIS — E119 Type 2 diabetes mellitus without complications: Secondary | ICD-10-CM | POA: Insufficient documentation

## 2022-12-18 DIAGNOSIS — K5730 Diverticulosis of large intestine without perforation or abscess without bleeding: Secondary | ICD-10-CM | POA: Insufficient documentation

## 2022-12-18 DIAGNOSIS — D120 Benign neoplasm of cecum: Secondary | ICD-10-CM | POA: Insufficient documentation

## 2022-12-18 DIAGNOSIS — Z1211 Encounter for screening for malignant neoplasm of colon: Secondary | ICD-10-CM | POA: Insufficient documentation

## 2022-12-18 DIAGNOSIS — K219 Gastro-esophageal reflux disease without esophagitis: Secondary | ICD-10-CM | POA: Diagnosis not present

## 2022-12-18 DIAGNOSIS — G4730 Sleep apnea, unspecified: Secondary | ICD-10-CM | POA: Diagnosis not present

## 2022-12-18 DIAGNOSIS — Z86010 Personal history of colonic polyps: Secondary | ICD-10-CM | POA: Insufficient documentation

## 2022-12-18 DIAGNOSIS — K640 First degree hemorrhoids: Secondary | ICD-10-CM | POA: Insufficient documentation

## 2022-12-18 DIAGNOSIS — I10 Essential (primary) hypertension: Secondary | ICD-10-CM | POA: Insufficient documentation

## 2022-12-18 DIAGNOSIS — R519 Headache, unspecified: Secondary | ICD-10-CM | POA: Insufficient documentation

## 2022-12-18 DIAGNOSIS — F419 Anxiety disorder, unspecified: Secondary | ICD-10-CM | POA: Diagnosis not present

## 2022-12-18 HISTORY — PX: COLONOSCOPY WITH PROPOFOL: SHX5780

## 2022-12-18 LAB — GLUCOSE, CAPILLARY: Glucose-Capillary: 165 mg/dL — ABNORMAL HIGH (ref 70–99)

## 2022-12-18 SURGERY — COLONOSCOPY WITH PROPOFOL
Anesthesia: General

## 2022-12-18 MED ORDER — LIDOCAINE HCL (PF) 1 % IJ SOLN
INTRAMUSCULAR | Status: AC
  Filled 2022-12-18: qty 2

## 2022-12-18 MED ORDER — SODIUM CHLORIDE 0.9 % IV SOLN: INTRAVENOUS | Status: DC

## 2022-12-18 MED ORDER — ONDANSETRON HCL 4 MG/2ML IJ SOLN
INTRAMUSCULAR | Status: DC | PRN
  Administered 2022-12-18: 4 mg via INTRAVENOUS

## 2022-12-18 MED ORDER — PROPOFOL 10 MG/ML IV BOLUS
INTRAVENOUS | Status: AC
  Filled 2022-12-18: qty 40

## 2022-12-18 MED ORDER — PROPOFOL 500 MG/50ML IV EMUL
INTRAVENOUS | Status: DC | PRN
  Administered 2022-12-18: 150 ug/kg/min via INTRAVENOUS
  Administered 2022-12-18: 70 mg via INTRAVENOUS

## 2022-12-18 NOTE — Op Note (Signed)
[facility] Gastroenterology Patient Name: Gail Rivas Procedure Date: 12/18/2022 12:18 PM MRN: [id number] Account #: [id number] Date of Birth: 02-Apr-1955 Admit Type: Outpatient Age: 67 Room: [facility] ENDO ROOM 1 Gender: Female Note Status: Finalized Instrument Name: Prentice Docker [id number] Procedure:             Colonoscopy Indications:           Surveillance: Personal history of adenomatous polyps                         on last colonoscopy 5 years ago Providers:             Eather Colas MD, MD Referring MD:          Duane Lope. Judithann Sheen, MD (Referring MD) Medicines:             Monitored Anesthesia Care Complications:         No immediate complications. Estimated blood loss:                         Minimal. Procedure:             Pre-Anesthesia Assessment:                        - Prior to the procedure, a History and Physical was                         performed, and patient medications and allergies were                         reviewed. The patient is competent. The risks and                         benefits of the procedure and the sedation options and                         risks were discussed with the patient. All questions                         were answered and informed consent was obtained.                         Patient identification and proposed procedure were                         verified by the physician, the nurse, the                         anesthesiologist, the anesthetist and the technician                         in the endoscopy suite. Mental Status Examination:                         alert and oriented. Airway Examination: normal                         oropharyngeal airway and neck mobility. Respiratory  Examination: clear to auscultation. CV Examination:                         normal. Prophylactic Antibiotics: The patient does not                         require prophylactic antibiotics. Prior                          Anticoagulants: The patient has taken no anticoagulant                         or antiplatelet agents. ASA Grade Assessment: II - A                         patient with mild systemic disease. After reviewing                         the risks and benefits, the patient was deemed in                         satisfactory condition to undergo the procedure. The                         anesthesia plan was to use monitored anesthesia care                         (MAC). Immediately prior to administration of                         medications, the patient was re-assessed for adequacy                         to receive sedatives. The heart rate, respiratory                         rate, oxygen saturations, blood pressure, adequacy of                         pulmonary ventilation, and response to care were                         monitored throughout the procedure. The physical                         status of the patient was re-assessed after the                         procedure.                        After obtaining informed consent, the colonoscope was                         passed under direct vision. Throughout the procedure,                         the patient's blood pressure, pulse, and oxygen  saturations were monitored continuously. The                         Colonoscope was introduced through the anus and                         advanced to the the cecum, identified by appendiceal                         orifice and ileocecal valve. The colonoscopy was                         performed without difficulty. The patient tolerated                         the procedure well. The quality of the bowel                         preparation was good. The ileocecal valve, appendiceal                         orifice, and rectum were photographed. Findings:      The perianal and digital rectal examinations were normal.      Multiple large-mouthed and  small-mouthed diverticula were found in the       sigmoid colon, descending colon, transverse colon, hepatic flexure,       ascending colon and cecum.      A 4 mm polyp was found in the appendiceal orifice. The polyp was       sessile. The polyp was removed with a cold snare. Resection and       retrieval were complete. Estimated blood loss was minimal.      Internal hemorrhoids were found during retroflexion. The hemorrhoids       were Grade I (internal hemorrhoids that do not prolapse).      The exam was otherwise without abnormality on direct and retroflexion       views. Impression:            - Diverticulosis in the sigmoid colon, in the                         descending colon, in the transverse colon, at the                         hepatic flexure, in the ascending colon and in the                         cecum.                        - One 4 mm polyp at the appendiceal orifice, removed                         with a cold snare. Resected and retrieved.                        - Internal hemorrhoids.                        - The examination was  otherwise normal on direct and                         retroflexion views. Recommendation:        - Discharge patient to home.                        - Resume previous diet.                        - Continue present medications.                        - Await pathology results.                        - Repeat colonoscopy in 5 years for surveillance.                        - Return to referring physician as previously                         scheduled. Procedure Code(s):     --- Professional ---                        [phone], Colonoscopy, flexible; with removal of                         tumor(s), polyp(s), or other lesion(s) by snare                         technique Diagnosis Code(s):     --- Professional ---                        Z86.010, Personal history of colonic polyps                        K64.0, First degree hemorrhoids                         D12.1, Benign neoplasm of appendix                        K57.30, Diverticulosis of large intestine without                         perforation or abscess without bleeding CPT copyright 2022 American Medical Association. All rights reserved. The codes documented in this report are preliminary and upon coder review may  be revised to meet current compliance requirements. Eather Colas MD, MD 12/18/2022 1:22:50 PM Number of Addenda: 0 Note Initiated On: 12/18/2022 12:18 PM Scope Withdrawal Time: 0 hours 7 minutes 45 seconds  Total Procedure Duration: 0 hours 11 minutes 52 seconds  Estimated Blood Loss:  Estimated blood loss was minimal.      [facility]

## 2022-12-18 NOTE — Transfer of Care (Signed)
Immediate Anesthesia Transfer of Care Note  Patient: Gail Rivas  Procedure(s) Performed: COLONOSCOPY WITH PROPOFOL  Patient Location: PACU  Anesthesia Type:MAC  Level of Consciousness: drowsy  Airway & Oxygen Therapy: Patient Spontanous Breathing and Patient connected to face mask oxygen  Post-op Assessment: Report given to RN and Post -op Vital signs reviewed and stable  Post vital signs: Reviewed  Last Vitals:  Vitals Value Taken Time  BP 104/51 12/18/22 1320  Temp 36.1 C 12/18/22 1320  Pulse 77 12/18/22 1321  Resp 22 12/18/22 1321  SpO2 99 % 12/18/22 1321  Vitals shown include unfiled device data.  Last Pain:  Vitals:   12/18/22 1320  TempSrc: Temporal  PainSc: Asleep         Complications: No notable events documented.

## 2022-12-18 NOTE — H&P (Signed)
Outpatient short stay form Pre-procedure 12/18/2022  Regis Bill, MD  Primary Physician: Marguarite Arbour, MD  Reason for visit:  Surveillance  History of present illness:    67 y/o lady with history of hypertension and DM II here for surveillance colonoscopy. Last colonoscopy 5 years ago with small Ta's. No blood thinners.    Current Facility-Administered Medications:    0.9 %  sodium chloride infusion, , Intravenous, Continuous, Chaney Ingram, Rossie Muskrat, MD, Last Rate: 20 mL/hr at 12/18/22 1155, New Bag at 12/18/22 1155  Medications Prior to Admission  Medication Sig Dispense Refill Last Dose   anastrozole (ARIMIDEX) 1 MG tablet Take 1 tablet (1 mg total) by mouth daily. 90 tablet 1 12/17/2022   calcium elemental as carbonate (BARIATRIC TUMS ULTRA) 400 MG chewable tablet Chew by mouth.   Past Week   cetirizine (ZYRTEC) 10 MG tablet Take 10 mg by mouth daily.   12/17/2022   Cholecalciferol 25 MCG (1000 UT) tablet Take 1,000 Units by mouth daily.   Past Week   glimepiride (AMARYL) 4 MG tablet Take 4 mg by mouth 2 (two) times daily.   12/17/2022   metFORMIN (GLUCOPHAGE) 1000 MG tablet Take 1,000 mg by mouth 2 (two) times daily with a meal.   12/17/2022   metoprolol (TOPROL-XL) 200 MG 24 hr tablet Take 200 mg by mouth daily.   12/17/2022   pioglitazone (ACTOS) 45 MG tablet Take 45 mg by mouth daily.   Past Week   telmisartan-hydrochlorothiazide (MICARDIS HCT) 80-25 MG tablet Take 1 tablet by mouth daily. Take 1 tablet by mouth daily    Past Week   clotrimazole-betamethasone (LOTRISONE) cream Apply 1 application topically daily as needed (eczema).       rosuvastatin (CRESTOR) 10 MG tablet Take by mouth.      Semaglutide 7 MG TABS Take 14 mg by mouth daily.   12/14/2022   sitaGLIPtin (JANUVIA) 100 MG tablet Take 100 mg by mouth daily. (Patient not taking: Reported on 12/18/2022)   Not Taking     Allergies  Allergen Reactions   Codeine Nausea And Vomiting   Sulfa Antibiotics Nausea And  Vomiting   Versed [Midazolam] Nausea And Vomiting   Penicillin V Rash    Has patient had a PCN reaction causing immediate rash, facial/tongue/throat swelling, SOB or lightheadedness with hypotension: yes Has patient had a PCN reaction causing severe rash involving mucus membranes or skin necrosis: no Has patient had a PCN reaction that required hospitalization: no Has patient had a PCN reaction occurring within the last 10 years: no If all of the above answers are "NO", then may proceed with Cephalosporin use.      Past Medical History:  Diagnosis Date   Anxiety    Breast cancer (HCC) 10/2017   invasive mammary carcinoma- Left   Diabetes mellitus without complication (HCC)    Fibrocystic disease of breast    GERD (gastroesophageal reflux disease)    Headache    Hyperlipidemia    Hypertension    Personal history of radiation therapy 2019   LEFT lumpectomy   PONV (postoperative nausea and vomiting)    Colonoscopy   Sleep apnea     Review of systems:  Otherwise negative.    Physical Exam  Gen: Alert, oriented. Appears stated age.  HEENT: PERRLA. Lungs: No respiratory distress CV: RRR Abd: soft, benign, no masses Ext: No edema   Planned procedures: Proceed with colonoscopy. The patient understands the nature of the planned procedure, indications, risks, alternatives and  potential complications including but not limited to bleeding, infection, perforation, damage to internal organs and possible oversedation/side effects from anesthesia. The patient agrees and gives consent to proceed.  Please refer to procedure notes for findings, recommendations and patient disposition/instructions.     Regis Bill, MD [facility] Gastroenterology

## 2022-12-18 NOTE — Interval H&P Note (Signed)
History and Physical Interval Note:  12/18/2022 12:44 PM  Gail Rivas  has presented today for surgery, with the diagnosis of Z86.010 (ICD-10-CM) - History of adenomatous polyp of colon.  The various methods of treatment have been discussed with the patient and family. After consideration of risks, benefits and other options for treatment, the patient has consented to  Procedure(s) with comments: COLONOSCOPY WITH PROPOFOL (N/A) - DM as a surgical intervention.  The patient's history has been reviewed, patient examined, no change in status, stable for surgery.  I have reviewed the patient's chart and labs.  Questions were answered to the patient's satisfaction.     Regis Bill  Ok to proceed with colonoscopy

## 2022-12-18 NOTE — Anesthesia Preprocedure Evaluation (Signed)
Anesthesia Evaluation  Patient identified by MRN, date of birth, ID band Patient awake    Reviewed: Allergy & Precautions, H&P , NPO status , Patient's Chart, lab work & pertinent test results  History of Anesthesia Complications (+) PONV and history of anesthetic complications  Airway Mallampati: III  TM Distance: >3 FB Neck ROM: full    Dental  (+) Missing   Pulmonary neg pulmonary ROS, neg shortness of breath, sleep apnea , neg COPD, neg recent URI   breath sounds clear to auscultation       Cardiovascular hypertension, (-) angina (-) CAD, (-) Past MI and (-) CABG negative cardio ROS (-) dysrhythmias (-) Valvular Problems/Murmurs Rhythm:regular Rate:Normal     Neuro/Psych  Headaches  Anxiety     negative neurological ROS  negative psych ROS   GI/Hepatic Neg liver ROS,GERD  ,,  Endo/Other  diabetes    Renal/GU negative Renal ROS  negative genitourinary   Musculoskeletal   Abdominal   Peds  Hematology negative hematology ROS (+)   Anesthesia Other Findings Past Medical History: No date: Anxiety No date: Cancer (HCC) No date: Diabetes mellitus without complication (HCC) No date: Fibrocystic disease of breast No date: GERD (gastroesophageal reflux disease) No date: Headache No date: Hyperlipidemia No date: Hypertension No date: PONV (postoperative nausea and vomiting)     Comment:  Colonoscopy No date: Sleep apnea  Past Surgical History: 10/30/2017: BREAST BIOPSY; Left     Comment:  left breast  stereo path penidng coil clip No date: BREAST CYST ASPIRATION; Left No date: COLONOSCOPY 05/10/2017: COLONOSCOPY WITH PROPOFOL; N/A     Comment:  Procedure: COLONOSCOPY WITH PROPOFOL;  Surgeon: Scot Jun, MD;  Location: [facility] ENDOSCOPY;  Service:               Endoscopy;  Laterality: N/A; No date: wisdom teeth removal  BMI    Body Mass Index:  27.44 kg/m       Reproductive/Obstetrics negative OB ROS                             Anesthesia Physical Anesthesia Plan  ASA: II  Anesthesia Plan: General   Post-op Pain Management:    Induction: Intravenous  PONV Risk Score and Plan: 4 or greater and Ondansetron, TIVA and Propofol infusion  Airway Management Planned: Natural Airway and Nasal Cannula  Additional Equipment:   Intra-op Plan:   Post-operative Plan:   Informed Consent: I have reviewed the patients History and Physical, chart, labs and discussed the procedure including the risks, benefits and alternatives for the proposed anesthesia with the patient or authorized representative who has indicated his/her understanding and acceptance.     Dental Advisory Given  Plan Discussed with: Anesthesiologist, CRNA and Surgeon  Anesthesia Plan Comments:         Anesthesia Quick Evaluation

## 2022-12-19 ENCOUNTER — Encounter: Payer: Self-pay | Admitting: Gastroenterology

## 2022-12-19 NOTE — Anesthesia Postprocedure Evaluation (Signed)
Anesthesia Post Note  Patient: Gail Rivas  Procedure(s) Performed: COLONOSCOPY WITH PROPOFOL  Patient location during evaluation: PACU Anesthesia Type: General Level of consciousness: awake and alert Pain management: pain level controlled Vital Signs Assessment: post-procedure vital signs reviewed and stable Respiratory status: spontaneous breathing, nonlabored ventilation, respiratory function stable and patient connected to nasal cannula oxygen Cardiovascular status: blood pressure returned to baseline and stable Postop Assessment: no apparent nausea or vomiting Anesthetic complications: no   No notable events documented.   Last Vitals:  Vitals:   12/18/22 1138 12/18/22 1320  BP: 139/73 (!) 104/51  Pulse: 74   Resp: 16   Temp: (!) 36.1 C (!) 36.1 C  SpO2: 98%     Last Pain:  Vitals:   12/18/22 1340  TempSrc:   PainSc: 0-No pain                 Yevette Edwards

## 2022-12-20 LAB — SURGICAL PATHOLOGY

## 2023-05-05 NOTE — Assessment & Plan Note (Addendum)
 Breast Cancer, pT1a N0, stage 1A ER/PR +, HER2 negative.  Arimidex  1 mg daily since 11/21/ 2019 Patient desires to continue AI for extended endocrine therapy. . Labs are reviewed and discussed with patient. Continue Arimidex  1 daily daily.  Continue annual mammogram - July 2025

## 2023-05-06 ENCOUNTER — Inpatient Hospital Stay: Payer: Medicare PPO | Attending: Oncology | Admitting: Oncology

## 2023-05-06 ENCOUNTER — Encounter: Payer: Self-pay | Admitting: Oncology

## 2023-05-06 VITALS — BP 119/66 | HR 69 | Temp 97.4°F | Resp 18 | Wt 192.4 lb

## 2023-05-06 DIAGNOSIS — Z170 Estrogen receptor positive status [ER+]: Secondary | ICD-10-CM | POA: Diagnosis not present

## 2023-05-06 DIAGNOSIS — Z79811 Long term (current) use of aromatase inhibitors: Secondary | ICD-10-CM | POA: Diagnosis not present

## 2023-05-06 DIAGNOSIS — Z800 Family history of malignant neoplasm of digestive organs: Secondary | ICD-10-CM | POA: Diagnosis not present

## 2023-05-06 DIAGNOSIS — Z8041 Family history of malignant neoplasm of ovary: Secondary | ICD-10-CM | POA: Insufficient documentation

## 2023-05-06 DIAGNOSIS — M8580 Other specified disorders of bone density and structure, unspecified site: Secondary | ICD-10-CM | POA: Insufficient documentation

## 2023-05-06 DIAGNOSIS — C50412 Malignant neoplasm of upper-outer quadrant of left female breast: Secondary | ICD-10-CM | POA: Diagnosis present

## 2023-05-06 MED ORDER — ANASTROZOLE 1 MG PO TABS: 1.0000 mg | ORAL_TABLET | Freq: Every day | ORAL | 1 refills | Status: DC

## 2023-05-06 NOTE — Assessment & Plan Note (Signed)
#  Chronic aromatase inhiitor use, . Continue calcium and vitamin D supplementation

## 2023-05-06 NOTE — Progress Notes (Signed)
 Hematology/Oncology Progress note [phone] [phone]    CHIEF COMPLAINTS Follow-up for management of breast cancer  ASSESSMENT & PLAN:   Malignant neoplasm of upper-outer quadrant of left breast in female, estrogen receptor positive (HCC)  Breast Cancer, pT1a N0, stage 1A ER/PR +, HER2 negative.  Arimidex  1 mg daily since 11/21/ 2019 Patient desires to continue AI for extended endocrine therapy. . Labs are reviewed and discussed with patient. Continue Arimidex  1 daily daily.  Continue annual mammogram - July 2025   Aromatase inhibitor use #Chronic aromatase inhiitor use, . Continue calcium and vitamin D supplementation  Osteopenia Dec 2023 DEXA showed osteopenia  major osteoporotic fracture is 7.3% within the next ten years. Continue calcium and vitamin D supplementation.  Repeat DEXA every 2 years- next due Dec 2025    Follow up in 6 months.  All questions were answered. The patient knows to call the clinic with any problems, questions or concerns.  Timmy Forbes, MD, PhD [facility] Hematology Oncology 05/06/2023    HISTORY OF PRESENTING ILLNESS:  Gail Rivas is a  68 y.o.  female with PMH listed below who was referred to me for  breast cancer. Patient had screening mammogram and ultrasound on 10/15/2017 which showed left breast possible mass warrant further evaluation. Diagnostic breast mammogram on 7/31 2019 showed 6 mm spiculated mass slight distortion in the outer left breast.  Targeted ultrasound was performed showing no definite sonographic correlate is identified in the left breast.  Left axilla is negative for lymphadenopathy.   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  Grade 1, ER PR positive [>90%], HER-2 negative  Family history: Maternal grandmother was diagnosed with ovarian cancer in the 45s.  Father had colon cancer. Patient declined genetic  testing.  # # 11/21/2017 She is s/p lumpectomy and sentinel lymph node biopsy.  Pathology showed 5mm invasive mammary carcinoma, grade 1, sentinel lymph node was negative.  stage 1A ER/PR +, HER2 negative.Her case was discussed at tumor board. Consensus was  No chemotherapy needed. adjuvant RT followed by anti estrogen treatment.  # # S/p Adjuvant RT finished 01/31/2018  # Started adjuvant antiestrogen with Letrozole  on 11/21/ 2019. 04/04/2018 switch to anastrozole  as letrozole  is on national back order. # bilateral diagnostic mammogram done on 10/17/2018.  No mammographic evidence of malignancy. #10/19/2020, bilateral screening mammogram is negative for mammographic evidence of malignancy.   # DEXA October 2021 showed osteopenia with 10-year probability of fracture 7.3%, major osteoporotic fracture 0.4%.  #10/20/2021, bilateral screening mammogram negative for malignancy.  Family history of cancer, declined genetic testing.  INTERVAL HISTORY Gail Rivas is a 68 y.o. female who has above history reviewed by me today presents for follow-up visit for management of pT1a N0, stage 1A ER/PR +, HER2 negative Breast cancer Patient has been on Arimidex  1 mg daily.  She tolerates well.  No new complaints.  Denies any breast concerns.   Review of Systems  Constitutional:  Negative for chills, fever, malaise/fatigue and weight loss.  HENT:  Negative for nosebleeds and sore throat.   Eyes:  Negative for double vision, photophobia and redness.  Respiratory:  Negative for cough, shortness of breath and wheezing.   Cardiovascular:  Negative for chest pain, palpitations, orthopnea and leg swelling.  Gastrointestinal:  Negative for abdominal pain, blood in stool, nausea and vomiting.  Genitourinary:  Negative for dysuria.  Musculoskeletal:  Negative for back pain, myalgias and neck pain.  Skin:  Negative for itching and rash.  Neurological:  Negative for dizziness, tingling and tremors.   Endo/Heme/Allergies:  Negative for environmental allergies. Does not bruise/bleed easily.  Psychiatric/Behavioral:  Negative for depression and hallucinations. The patient is not nervous/anxious.     MEDICAL HISTORY:  Past Medical History:  Diagnosis Date   Anxiety    Breast cancer (HCC) 10/2017   invasive mammary carcinoma- Left   Diabetes mellitus without complication (HCC)    Fibrocystic disease of breast    GERD (gastroesophageal reflux disease)    Headache    Hyperlipidemia    Hypertension    Personal history of radiation therapy 2019   LEFT lumpectomy   PONV (postoperative nausea and vomiting)    Colonoscopy   Sleep apnea     SURGICAL HISTORY: Past Surgical History:  Procedure Laterality Date   BREAST BIOPSY Left 10/30/2017   invasive mammary carcinoma   BREAST CYST ASPIRATION Left    BREAST LUMPECTOMY Left 11/21/2017   invasive mammary carcinoma    COLONOSCOPY     COLONOSCOPY WITH PROPOFOL  N/A 05/10/2017   Procedure: COLONOSCOPY WITH PROPOFOL ;  Surgeon: Cassie Click, MD;  Location: [facility] ENDOSCOPY;  Service: Endoscopy;  Laterality: N/A;   COLONOSCOPY WITH PROPOFOL  N/A 12/18/2022   Procedure: COLONOSCOPY WITH PROPOFOL ;  Surgeon: Shane Darling, MD;  Location: [facility] ENDOSCOPY;  Service: Endoscopy;  Laterality: N/A;  DM   PARTIAL MASTECTOMY WITH NEEDLE LOCALIZATION AND AXILLARY SENTINEL LYMPH NODE BX Left 11/21/2017   Procedure: PARTIAL MASTECTOMY WITH NEEDLE LOCALIZATION AND AXILLARY SENTINEL LYMPH NODE BX;  Surgeon: Conrado Delay, DO;  Location: [facility] ORS;  Service: General;  Laterality: Left;   wisdom teeth removal      SOCIAL HISTORY: Social History   Socioeconomic History   Marital status: Married    Spouse name: Not on file   Number of children: Not on file   Years of education: Not on file   Highest education level: Not on file  Occupational History   Not on file  Tobacco Use   Smoking status: Never   Smokeless tobacco: Never  Vaping Use    Vaping status: Never Used  Substance and Sexual Activity   Alcohol use: No   Drug use: No   Sexual activity: Yes  Other Topics Concern   Not on file  Social History Narrative   Not on file   Social Drivers of Health   Financial Resource Strain: Not on file  Food Insecurity: Not on file  Transportation Needs: Not on file  Physical Activity: Not on file  Stress: Not on file  Social Connections: Not on file  Intimate Partner Violence: Not on file    FAMILY HISTORY: Family History  Problem Relation Age of Onset   Hypertension Mother    Colon cancer Father    Liver cancer Father    Diabetes Father    Ovarian cancer Maternal Grandmother    Breast cancer Neg Hx     ALLERGIES:  is allergic to codeine, sulfa antibiotics, versed  [midazolam ], and penicillin v.  MEDICATIONS:  Current Outpatient Medications  Medication Sig Dispense Refill   calcium elemental as carbonate (BARIATRIC TUMS ULTRA) 400 MG chewable tablet Chew by mouth.     cetirizine (ZYRTEC) 10 MG tablet Take 10 mg by mouth daily.     Cholecalciferol 25 MCG (1000 UT) tablet Take 1,000 Units by mouth daily.     clotrimazole-betamethasone (LOTRISONE) cream Apply 1 application topically daily as needed (eczema).  glimepiride (AMARYL) 4 MG tablet Take 4 mg by mouth 2 (two) times daily.     metFORMIN (GLUCOPHAGE) 1000 MG tablet Take 1,000 mg by mouth 2 (two) times daily with a meal.     metoprolol (TOPROL-XL) 200 MG 24 hr tablet Take 200 mg by mouth daily.     pioglitazone (ACTOS) 45 MG tablet Take 45 mg by mouth daily.     rosuvastatin (CRESTOR) 10 MG tablet Take by mouth.     RYBELSUS 14 MG TABS Take 1 tablet by mouth daily.     Semaglutide 7 MG TABS Take 14 mg by mouth daily.     telmisartan-hydrochlorothiazide (MICARDIS HCT) 80-25 MG tablet Take 1 tablet by mouth daily. Take 1 tablet by mouth daily      anastrozole  (ARIMIDEX ) 1 MG tablet Take 1 tablet (1 mg total) by mouth daily. 90 tablet 1   sitaGLIPtin  (JANUVIA) 100 MG tablet Take 100 mg by mouth daily. (Patient not taking: Reported on 12/18/2022)     No current facility-administered medications for this visit.     PHYSICAL EXAMINATION: ECOG PERFORMANCE STATUS: 0 - Asymptomatic Vitals:   05/06/23 1050  BP: 119/66  Pulse: 69  Resp: 18  Temp: (!) 97.4 F (36.3 C)  SpO2: 97%   Filed Weights   05/06/23 1050  Weight: 192 lb 6.4 oz (87.3 kg)    Physical Exam Constitutional:      General: She is not in acute distress. HENT:     Head: Normocephalic and atraumatic.  Eyes:     General: No scleral icterus. Cardiovascular:     Rate and Rhythm: Normal rate and regular rhythm.     Heart sounds: Normal heart sounds.  Pulmonary:     Effort: Pulmonary effort is normal. No respiratory distress.     Breath sounds: No wheezing.  Abdominal:     General: Bowel sounds are normal. There is no distension.     Palpations: Abdomen is soft.  Musculoskeletal:        General: No deformity. Normal range of motion.     Cervical back: Normal range of motion and neck supple.  Skin:    General: Skin is warm and dry.     Findings: No erythema or rash.  Neurological:     Mental Status: She is alert and oriented to person, place, and time. Mental status is at baseline.     Cranial Nerves: No cranial nerve deficit.     Coordination: Coordination normal.  Psychiatric:        Mood and Affect: Mood normal.   Breast exam was performed in seated and lying down position. Patient is status post left lumpectomy with a well-healed surgical scar, with chronic local scar/thickening tissue at lumpectomy site.  No evidence of any palpable masses bilaterally. No evidence of bilateral axillary adenopathy.   LABORATORY DATA:  I have reviewed the data as listed    Latest Ref Rng & Units 01/19/2019    8:45 AM 10/20/2018    9:33 AM 07/07/2018    1:27 PM  CBC  WBC 4.0 - 10.5 K/uL 6.2  5.9  8.1   Hemoglobin 12.0 - 15.0 g/dL 86.5  78.4  69.6   Hematocrit 36.0 -  46.0 % 40.0  40.5  41.1   Platelets 150 - 400 K/uL 155  151  195       Latest Ref Rng & Units 01/19/2019    8:45 AM 10/20/2018    9:33 AM 07/07/2018  1:27 PM  CMP  Glucose 70 - 99 mg/dL 161  096  045   BUN 8 - 23 mg/dL 14  18  18    Creatinine 0.44 - 1.00 mg/dL 4.09  8.11  9.14   Sodium 135 - 145 mmol/L 137  136  137   Potassium 3.5 - 5.1 mmol/L 4.2  3.8  4.4   Chloride 98 - 111 mmol/L 103  101  102   CO2 22 - 32 mmol/L 26  23  23    Calcium 8.9 - 10.3 mg/dL 9.3  9.5  9.2   Total Protein 6.5 - 8.1 g/dL 6.6  7.1  7.1   Total Bilirubin 0.3 - 1.2 mg/dL 1.3  1.3  1.1   Alkaline Phos 38 - 126 U/L 60  60  64   AST 15 - 41 U/L 22  21  23    ALT 0 - 44 U/L 23  26  31      RADIOGRAPHIC STUDIES: I have personally reviewed the radiological images as listed and agreed with the findings in the report. No results found.

## 2023-05-06 NOTE — Assessment & Plan Note (Signed)
 Dec 2023 DEXA showed osteopenia  major osteoporotic fracture is 7.3% within the next ten years. Continue calcium and vitamin D supplementation.  Repeat DEXA every 2 years- next due Dec 2025

## 2023-10-15 ENCOUNTER — Ambulatory Visit: Payer: Medicare PPO | Admitting: Oncology

## 2023-10-29 ENCOUNTER — Ambulatory Visit
Admission: RE | Admit: 2023-10-29 | Discharge: 2023-10-29 | Disposition: A | Discharge status: Home / Self Care | Source: Ambulatory Visit | Attending: Oncology | Admitting: Oncology

## 2023-10-29 DIAGNOSIS — C50412 Malignant neoplasm of upper-outer quadrant of left female breast: Secondary | ICD-10-CM | POA: Insufficient documentation

## 2023-10-29 DIAGNOSIS — Z170 Estrogen receptor positive status [ER+]: Secondary | ICD-10-CM | POA: Diagnosis present

## 2023-10-29 DIAGNOSIS — Z1231 Encounter for screening mammogram for malignant neoplasm of breast: Secondary | ICD-10-CM | POA: Diagnosis present

## 2023-11-13 ENCOUNTER — Encounter: Payer: Self-pay | Admitting: Oncology

## 2023-11-13 ENCOUNTER — Inpatient Hospital Stay: Payer: Medicare PPO | Attending: Oncology | Admitting: Oncology

## 2023-11-13 VITALS — BP 116/62 | HR 56 | Temp 97.1°F | Resp 18 | Wt 195.5 lb

## 2023-11-13 DIAGNOSIS — Z79811 Long term (current) use of aromatase inhibitors: Secondary | ICD-10-CM | POA: Diagnosis not present

## 2023-11-13 DIAGNOSIS — Z9012 Acquired absence of left breast and nipple: Secondary | ICD-10-CM | POA: Insufficient documentation

## 2023-11-13 DIAGNOSIS — M8580 Other specified disorders of bone density and structure, unspecified site: Secondary | ICD-10-CM | POA: Insufficient documentation

## 2023-11-13 DIAGNOSIS — C50412 Malignant neoplasm of upper-outer quadrant of left female breast: Secondary | ICD-10-CM | POA: Diagnosis present

## 2023-11-13 DIAGNOSIS — Z170 Estrogen receptor positive status [ER+]: Secondary | ICD-10-CM | POA: Insufficient documentation

## 2023-11-13 MED ORDER — ANASTROZOLE 1 MG PO TABS: 1.0000 mg | ORAL_TABLET | Freq: Every day | ORAL | 1 refills | Status: AC

## 2023-11-13 NOTE — Assessment & Plan Note (Signed)
#  Chronic aromatase inhiitor use, . Continue calcium and vitamin D supplementation

## 2023-11-13 NOTE — Progress Notes (Signed)
 Hematology/Oncology Progress note [phone] [phone]    CHIEF COMPLAINTS Follow-up for management of breast cancer  ASSESSMENT & PLAN:   Malignant neoplasm of upper-outer quadrant of left breast in female, estrogen receptor positive (HCC)  Breast Cancer, pT1a N0, stage 1A ER/PR +, HER2 negative.  Arimidex  1 mg daily since 11/21/ 2019 Patient desires to continue AI for extended endocrine therapy. . Labs are reviewed and discussed with patient. Continue Arimidex  1 daily daily.  Continue annual mammogram - July 2025 reviewed.   Aromatase inhibitor use #Chronic aromatase inhiitor use, . Continue calcium and vitamin D supplementation  Osteopenia Dec 2023 DEXA showed osteopenia  major osteoporotic fracture is 7.3% within the next ten years. Continue calcium and vitamin D supplementation.  Repeat DEXA every 2 years- next due Dec 2025    Follow up in 6 months.  All questions were answered. The patient knows to call the clinic with any problems, questions or concerns.  Zelphia Cap, MD, PhD [facility] Hematology Oncology 11/13/2023    HISTORY OF PRESENTING ILLNESS:  Gail Rivas is a  68 y.o.  female with PMH listed below who was referred to me for  breast cancer. Patient had screening mammogram and ultrasound on 10/15/2017 which showed left breast possible mass warrant further evaluation. Diagnostic breast mammogram on 7/31 2019 showed 6 mm spiculated mass slight distortion in the outer left breast.  Targeted ultrasound was performed showing no definite sonographic correlate is identified in the left breast.  Left axilla is negative for lymphadenopathy.   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  Grade 1, ER PR positive [>90%], HER-2 negative  Family history: Maternal grandmother was diagnosed with ovarian cancer in the 50s.  Father had colon cancer. Patient  declined genetic testing.  # # 11/21/2017 She is s/p lumpectomy and sentinel lymph node biopsy.  Pathology showed 5mm invasive mammary carcinoma, grade 1, sentinel lymph node was negative.  stage 1A ER/PR +, HER2 negative.Her case was discussed at tumor board. Consensus was  No chemotherapy needed. adjuvant RT followed by anti estrogen treatment.  # # S/p Adjuvant RT finished 01/31/2018  # Started adjuvant antiestrogen with Letrozole  on 11/21/ 2019. 04/04/2018 switch to anastrozole  as letrozole  is on national back order. # bilateral diagnostic mammogram done on 10/17/2018.  No mammographic evidence of malignancy. #10/19/2020, bilateral screening mammogram is negative for mammographic evidence of malignancy.   # DEXA October 2021 showed osteopenia with 10-year probability of fracture 7.3%, major osteoporotic fracture 0.4%.  #10/20/2021, bilateral screening mammogram negative for malignancy.  Family history of cancer, declined genetic testing.  INTERVAL HISTORY Gail Rivas is a 68 y.o. female who has above history reviewed by me today presents for follow-up visit for management of pT1a N0, stage 1A ER/PR +, HER2 negative Breast cancer Patient has been on Arimidex  1 mg daily.  She tolerates well.  No new complaints.  Denies any breast concerns.   Review of Systems  Constitutional:  Negative for chills, fever, malaise/fatigue and weight loss.  HENT:  Negative for nosebleeds and sore throat.   Eyes:  Negative for double vision, photophobia and redness.  Respiratory:  Negative for cough, shortness of breath and wheezing.   Cardiovascular:  Negative for chest pain, palpitations, orthopnea and leg swelling.  Gastrointestinal:  Negative for abdominal pain, blood in stool, nausea and vomiting.  Genitourinary:  Negative for dysuria.  Musculoskeletal:  Negative for back pain, myalgias and neck pain.  Skin:  Negative for itching and rash.  Neurological:  Negative for dizziness, tingling and  tremors.  Endo/Heme/Allergies:  Negative for environmental allergies. Does not bruise/bleed easily.  Psychiatric/Behavioral:  Negative for depression and hallucinations. The patient is not nervous/anxious.     MEDICAL HISTORY:  Past Medical History:  Diagnosis Date   Anxiety    Breast cancer (HCC) 10/2017   invasive mammary carcinoma- Left   Diabetes mellitus without complication (HCC)    Fibrocystic disease of breast    GERD (gastroesophageal reflux disease)    Headache    Hyperlipidemia    Hypertension    Personal history of radiation therapy 2019   LEFT lumpectomy   PONV (postoperative nausea and vomiting)    Colonoscopy   Sleep apnea     SURGICAL HISTORY: Past Surgical History:  Procedure Laterality Date   BREAST BIOPSY Left 10/30/2017   invasive mammary carcinoma   BREAST CYST ASPIRATION Left    BREAST LUMPECTOMY Left 11/21/2017   invasive mammary carcinoma    COLONOSCOPY     COLONOSCOPY WITH PROPOFOL  N/A 05/10/2017   Procedure: COLONOSCOPY WITH PROPOFOL ;  Surgeon: Viktoria Lamar DASEN, MD;  Location: [facility] ENDOSCOPY;  Service: Endoscopy;  Laterality: N/A;   COLONOSCOPY WITH PROPOFOL  N/A 12/18/2022   Procedure: COLONOSCOPY WITH PROPOFOL ;  Surgeon: Maryruth Ole DASEN, MD;  Location: [facility] ENDOSCOPY;  Service: Endoscopy;  Laterality: N/A;  DM   PARTIAL MASTECTOMY WITH NEEDLE LOCALIZATION AND AXILLARY SENTINEL LYMPH NODE BX Left 11/21/2017   Procedure: PARTIAL MASTECTOMY WITH NEEDLE LOCALIZATION AND AXILLARY SENTINEL LYMPH NODE BX;  Surgeon: Tye Millet, DO;  Location: [facility] ORS;  Service: General;  Laterality: Left;   wisdom teeth removal      SOCIAL HISTORY: Social History   Socioeconomic History   Marital status: Married    Spouse name: Not on file   Number of children: Not on file   Years of education: Not on file   Highest education level: Not on file  Occupational History   Not on file  Tobacco Use   Smoking status: Never   Smokeless tobacco: Never  Vaping Use    Vaping status: Never Used  Substance and Sexual Activity   Alcohol use: No   Drug use: No   Sexual activity: Yes  Other Topics Concern   Not on file  Social History Narrative   Not on file   Social Drivers of Health   Financial Resource Strain: Low Risk  (05/21/2023)   Received from [facility]   Overall Financial Resource Strain (CARDIA)    Difficulty of Paying Living Expenses: Not hard at all  Food Insecurity: No Food Insecurity (05/21/2023)   Received from [facility]   Hunger Vital Sign    Within the past 12 months, you worried that your food would run out before you got the money to buy more.: Never true    Within the past 12 months, the food you bought just didn't last and you didn't have money to get more.: Never true  Transportation Needs: No Transportation Needs (05/21/2023)   Received from [facility] - Transportation    In the past 12 months, has lack of transportation kept you from medical appointments or from getting medications?: No    Lack of Transportation (Non-Medical): No  Physical Activity: Not on file  Stress: Not on file  Social Connections: Not on file  Intimate Partner Violence: Not on file    FAMILY HISTORY: Family  History  Problem Relation Age of Onset   Hypertension Mother    Colon cancer Father    Liver cancer Father    Diabetes Father    Ovarian cancer Maternal Grandmother    Breast cancer Neg Hx     ALLERGIES:  is allergic to codeine, sulfa antibiotics, versed  [midazolam ], and penicillin v.  MEDICATIONS:  Current Outpatient Medications  Medication Sig Dispense Refill   calcium elemental as carbonate (BARIATRIC TUMS ULTRA) 400 MG chewable tablet Chew by mouth.     cetirizine (ZYRTEC) 10 MG tablet Take 10 mg by mouth daily.     Cholecalciferol 25 MCG (1000 UT) tablet Take 1,000 Units by mouth daily.     clotrimazole-betamethasone (LOTRISONE) cream Apply 1 application topically  daily as needed (eczema).      glimepiride (AMARYL) 4 MG tablet Take 4 mg by mouth 2 (two) times daily.     metFORMIN (GLUCOPHAGE) 1000 MG tablet Take 1,000 mg by mouth 2 (two) times daily with a meal.     metoprolol (TOPROL-XL) 200 MG 24 hr tablet Take 200 mg by mouth daily.     pioglitazone (ACTOS) 45 MG tablet Take 45 mg by mouth daily.     rosuvastatin (CRESTOR) 10 MG tablet Take by mouth.     Semaglutide 7 MG TABS Take 14 mg by mouth daily.     telmisartan-hydrochlorothiazide (MICARDIS HCT) 80-25 MG tablet Take 1 tablet by mouth daily. Take 1 tablet by mouth daily      anastrozole  (ARIMIDEX ) 1 MG tablet Take 1 tablet (1 mg total) by mouth daily. 90 tablet 1   RYBELSUS 14 MG TABS Take 1 tablet by mouth daily. (Patient not taking: Reported on 11/13/2023)     sitaGLIPtin (JANUVIA) 100 MG tablet Take 100 mg by mouth daily. (Patient not taking: Reported on 11/13/2023)     No current facility-administered medications for this visit.     PHYSICAL EXAMINATION: ECOG PERFORMANCE STATUS: 0 - Asymptomatic Vitals:   11/13/23 1058  BP: 116/62  Pulse: (!) 56  Resp: 18  Temp: (!) 97.1 F (36.2 C)  SpO2: 99%   Filed Weights   11/13/23 1058  Weight: 195 lb 8 oz (88.7 kg)    Physical Exam Constitutional:      General: She is not in acute distress. HENT:     Head: Normocephalic and atraumatic.  Eyes:     General: No scleral icterus. Cardiovascular:     Rate and Rhythm: Normal rate and regular rhythm.     Heart sounds: Normal heart sounds.  Pulmonary:     Effort: Pulmonary effort is normal. No respiratory distress.     Breath sounds: No wheezing.  Abdominal:     General: Bowel sounds are normal. There is no distension.     Palpations: Abdomen is soft.  Musculoskeletal:        General: No deformity. Normal range of motion.     Cervical back: Normal range of motion and neck supple.  Skin:    General: Skin is warm and dry.     Findings: No erythema or rash.  Neurological:     Mental  Status: She is alert and oriented to person, place, and time. Mental status is at baseline.     Cranial Nerves: No cranial nerve deficit.     Coordination: Coordination normal.  Psychiatric:        Mood and Affect: Mood normal.   Breast exam was performed in seated and lying down position. Patient is  status post left lumpectomy with a well-healed surgical scar, with chronic local scar/thickening tissue at lumpectomy site.  No evidence of any palpable masses bilaterally. No evidence of bilateral axillary adenopathy.   LABORATORY DATA:  I have reviewed the data as listed    Latest Ref Rng & Units 01/19/2019    8:45 AM 10/20/2018    9:33 AM 07/07/2018    1:27 PM  CBC  WBC 4.0 - 10.5 K/uL 6.2  5.9  8.1   Hemoglobin 12.0 - 15.0 g/dL 86.8  86.5  86.3   Hematocrit 36.0 - 46.0 % 40.0  40.5  41.1   Platelets 150 - 400 K/uL 155  151  195       Latest Ref Rng & Units 01/19/2019    8:45 AM 10/20/2018    9:33 AM 07/07/2018    1:27 PM  CMP  Glucose 70 - 99 mg/dL 850  724  840   BUN 8 - 23 mg/dL 14  18  18    Creatinine 0.44 - 1.00 mg/dL 9.24  9.17  9.20   Sodium 135 - 145 mmol/L 137  136  137   Potassium 3.5 - 5.1 mmol/L 4.2  3.8  4.4   Chloride 98 - 111 mmol/L 103  101  102   CO2 22 - 32 mmol/L 26  23  23    Calcium 8.9 - 10.3 mg/dL 9.3  9.5  9.2   Total Protein 6.5 - 8.1 g/dL 6.6  7.1  7.1   Total Bilirubin 0.3 - 1.2 mg/dL 1.3  1.3  1.1   Alkaline Phos 38 - 126 U/L 60  60  64   AST 15 - 41 U/L 22  21  23    ALT 0 - 44 U/L 23  26  31      RADIOGRAPHIC STUDIES: I have personally reviewed the radiological images as listed and agreed with the findings in the report. MM 3D SCREENING MAMMOGRAM BILATERAL BREAST Result Date: 10/31/2023 CLINICAL DATA:  Screening. EXAM: DIGITAL SCREENING BILATERAL MAMMOGRAM WITH TOMOSYNTHESIS AND CAD TECHNIQUE: Bilateral screening digital craniocaudal and mediolateral oblique mammograms were obtained. Bilateral screening digital breast tomosynthesis was performed.  The images were evaluated with computer-aided detection. COMPARISON:  Previous exam(s). ACR Breast Density Category b: There are scattered areas of fibroglandular density. FINDINGS: There are no findings suspicious for malignancy. IMPRESSION: No mammographic evidence of malignancy. A result letter of this screening mammogram will be mailed directly to the patient. RECOMMENDATION: Screening mammogram in one year. (Code:SM-B-01Y) BI-RADS CATEGORY  1: Negative. Electronically Signed   By: Toribio Agreste M.D.   On: 10/31/2023 13:00

## 2023-11-13 NOTE — Assessment & Plan Note (Signed)
 Dec 2023 DEXA showed osteopenia  major osteoporotic fracture is 7.3% within the next ten years. Continue calcium and vitamin D supplementation.  Repeat DEXA every 2 years- next due Dec 2025

## 2023-11-13 NOTE — Assessment & Plan Note (Addendum)
 Breast Cancer, pT1a N0, stage 1A ER/PR +, HER2 negative.  Arimidex  1 mg daily since 11/21/ 2019 Patient desires to continue AI for extended endocrine therapy. . Labs are reviewed and discussed with patient. Continue Arimidex  1 daily daily.  Continue annual mammogram - July 2025 reviewed.

## 2023-12-03 ENCOUNTER — Ambulatory Visit: Payer: Self-pay | Admitting: Podiatry

## 2023-12-03 DIAGNOSIS — B351 Tinea unguium: Secondary | ICD-10-CM | POA: Diagnosis not present

## 2023-12-03 DIAGNOSIS — Z79899 Other long term (current) drug therapy: Secondary | ICD-10-CM | POA: Diagnosis not present

## 2023-12-03 MED ORDER — TERBINAFINE HCL 250 MG PO TABS: 250.0000 mg | ORAL_TABLET | Freq: Every day | ORAL | 0 refills | Status: AC

## 2023-12-03 NOTE — Progress Notes (Signed)
 Subjective:  Patient ID: Gail Rivas, female    DOB: 1955/05/21,  MRN: [id number]  Chief Complaint  Patient presents with   Nail Problem    Thick nails     68 y.o. female presents with the above complaint.  Patient presents with right 2nd and 4th digit thickening of dystrophic mycotic nail x 1.  Patient states is painful to touch has progressed gotten worse wanted discuss treatment options for it.  She has tried some topical medication which has not helped.  She would like to discuss oral medication.   Review of Systems: Negative except as noted in the HPI. Denies N/V/F/Ch.  Past Medical History:  Diagnosis Date   Anxiety    Breast cancer (HCC) 10/2017   invasive mammary carcinoma- Left   Diabetes mellitus without complication (HCC)    Fibrocystic disease of breast    GERD (gastroesophageal reflux disease)    Headache    Hyperlipidemia    Hypertension    Personal history of radiation therapy 2019   LEFT lumpectomy   PONV (postoperative nausea and vomiting)    Colonoscopy   Sleep apnea     Current Outpatient Medications:    terbinafine  (LAMISIL ) 250 MG tablet, Take 1 tablet (250 mg total) by mouth daily., Disp: 90 tablet, Rfl: 0   anastrozole  (ARIMIDEX ) 1 MG tablet, Take 1 tablet (1 mg total) by mouth daily., Disp: 90 tablet, Rfl: 1   calcium elemental as carbonate (BARIATRIC TUMS ULTRA) 400 MG chewable tablet, Chew by mouth., Disp: , Rfl:    cetirizine (ZYRTEC) 10 MG tablet, Take 10 mg by mouth daily., Disp: , Rfl:    Cholecalciferol 25 MCG (1000 UT) tablet, Take 1,000 Units by mouth daily., Disp: , Rfl:    clotrimazole-betamethasone (LOTRISONE) cream, Apply 1 application topically daily as needed (eczema). , Disp: , Rfl:    glimepiride (AMARYL) 4 MG tablet, Take 4 mg by mouth 2 (two) times daily., Disp: , Rfl:    metFORMIN (GLUCOPHAGE) 1000 MG tablet, Take 1,000 mg by mouth 2 (two) times daily with a meal., Disp: , Rfl:    metoprolol (TOPROL-XL) 200 MG 24 hr tablet,  Take 200 mg by mouth daily., Disp: , Rfl:    pioglitazone (ACTOS) 45 MG tablet, Take 45 mg by mouth daily., Disp: , Rfl:    rosuvastatin (CRESTOR) 10 MG tablet, Take by mouth., Disp: , Rfl:    RYBELSUS 14 MG TABS, Take 1 tablet by mouth daily. (Patient not taking: Reported on 11/13/2023), Disp: , Rfl:    Semaglutide 7 MG TABS, Take 14 mg by mouth daily., Disp: , Rfl:    sitaGLIPtin (JANUVIA) 100 MG tablet, Take 100 mg by mouth daily. (Patient not taking: Reported on 11/13/2023), Disp: , Rfl:    telmisartan-hydrochlorothiazide (MICARDIS HCT) 80-25 MG tablet, Take 1 tablet by mouth daily. Take 1 tablet by mouth daily , Disp: , Rfl:   Social History   Tobacco Use  Smoking Status Never  Smokeless Tobacco Never    Allergies  Allergen Reactions   Codeine Nausea And Vomiting   Sulfa Antibiotics Nausea And Vomiting   Versed  [Midazolam ] Nausea And Vomiting   Penicillin V Rash    Has patient had a PCN reaction causing immediate rash, facial/tongue/throat swelling, SOB or lightheadedness with hypotension: yes Has patient had a PCN reaction causing severe rash involving mucus membranes or skin necrosis: no Has patient had a PCN reaction that required hospitalization: no Has patient had a PCN reaction occurring within the last  10 years: no If all of the above answers are NO, then may proceed with Cephalosporin use.    Objective:  There were no vitals filed for this visit. There is no height or weight on file to calculate BMI. Constitutional Well developed. Well nourished.  Vascular Dorsalis pedis pulses palpable bilaterally. Posterior tibial pulses palpable bilaterally. Capillary refill normal to all digits.  No cyanosis or clubbing noted. Pedal hair growth normal.  Neurologic Normal speech. Oriented to person, place, and time. Epicritic sensation to light touch grossly present bilaterally.  Dermatologic Nails thickened onychodystrophy mycotic nail x 2 right 2nd and 4th digit Skin within  normal limits  Orthopedic: Normal joint ROM without pain or crepitus bilaterally. No visible deformities. No bony tenderness.   Radiographs: None Assessment:  No diagnosis found. Plan:  Patient was evaluated and treated and all questions answered.  Right 2nd and 4th digit onychomycosis -Educated the patient on the etiology of onychomycosis and various treatment options associated with improving the fungal load.  I explained to the patient that there is 3 treatment options available to treat the onychomycosis including topical, p.o., laser treatment.  Patient elected to undergo p.o. options with Lamisil /terbinafine  therapy.  In order for me to start the medication therapy, I explained to the patient the importance of evaluating the liver and obtaining the liver function test.  Once the liver function test comes back normal I will start him on 36-month course of Lamisil  therapy.  Patient understood all risk and would like to proceed with Lamisil  therapy.  I have asked the patient to immediately stop the Lamisil  therapy if she has any reactions to it and call the office or go to the emergency room right away.  Patient states understanding   No follow-ups on file.

## 2024-01-06 ENCOUNTER — Telehealth: Payer: Self-pay | Admitting: Oncology

## 2024-01-06 NOTE — Telephone Encounter (Signed)
 Spoke with pt and reminded her of DEXA in Dec. I told her I could provide [place] phone number or transfer her and she stated I can transfer the phone call.   Call transferred.

## 2024-03-23 ENCOUNTER — Ambulatory Visit
Admission: RE | Admit: 2024-03-23 | Discharge: 2024-03-23 | Disposition: A | Source: Ambulatory Visit | Attending: Oncology

## 2024-03-23 DIAGNOSIS — C50412 Malignant neoplasm of upper-outer quadrant of left female breast: Secondary | ICD-10-CM | POA: Diagnosis present

## 2024-03-23 DIAGNOSIS — M8589 Other specified disorders of bone density and structure, multiple sites: Secondary | ICD-10-CM | POA: Diagnosis not present

## 2024-03-23 DIAGNOSIS — Z170 Estrogen receptor positive status [ER+]: Secondary | ICD-10-CM | POA: Diagnosis present

## 2024-04-02 ENCOUNTER — Ambulatory Visit: Admitting: Podiatry

## 2024-04-02 DIAGNOSIS — Z79899 Other long term (current) drug therapy: Secondary | ICD-10-CM | POA: Diagnosis not present

## 2024-04-02 DIAGNOSIS — B351 Tinea unguium: Secondary | ICD-10-CM | POA: Diagnosis not present

## 2024-04-02 NOTE — Progress Notes (Unsigned)
 Secomd round

## 2024-04-03 ENCOUNTER — Other Ambulatory Visit: Payer: Self-pay | Admitting: Podiatry

## 2024-04-03 LAB — HEPATIC FUNCTION PANEL
ALT: 15 IU/L (ref 0–32)
AST: 16 IU/L (ref 0–40)
Albumin: 4.3 g/dL (ref 3.9–4.9)
Alkaline Phosphatase: 89 IU/L (ref 49–135)
Bilirubin Total: 0.9 mg/dL (ref 0.0–1.2)
Bilirubin, Direct: 0.19 mg/dL (ref 0.00–0.40)
Total Protein: 6.3 g/dL (ref 6.0–8.5)

## 2024-04-03 MED ORDER — TERBINAFINE HCL 250 MG PO TABS: 250.0000 mg | ORAL_TABLET | Freq: Every day | ORAL | 0 refills | Status: AC

## 2024-05-26 ENCOUNTER — Ambulatory Visit: Admitting: Oncology

## 2024-07-30 ENCOUNTER — Ambulatory Visit: Admitting: Podiatry
